# Patient Record
Sex: Male | Born: 1970 | Race: Black or African American | Hispanic: No | Marital: Single | Smoking: Never smoker
Health system: Southern US, Community
[De-identification: ages and names within clinical notes are randomized; demographics above are authoritative.]

## PROBLEM LIST (undated history)

## (undated) DIAGNOSIS — M109 Gout, unspecified: Secondary | ICD-10-CM

## (undated) DIAGNOSIS — I10 Essential (primary) hypertension: Secondary | ICD-10-CM

## (undated) DIAGNOSIS — W3400XA Accidental discharge from unspecified firearms or gun, initial encounter: Secondary | ICD-10-CM

## (undated) HISTORY — PX: OTHER SURGICAL HISTORY: SHX169

---

## 2005-09-09 ENCOUNTER — Emergency Department (HOSPITAL_COMMUNITY): Admission: EM | Admit: 2005-09-09 | Discharge: 2005-09-09 | Payer: Self-pay | Admitting: Family Medicine

## 2006-02-26 ENCOUNTER — Emergency Department (HOSPITAL_COMMUNITY): Admission: EM | Admit: 2006-02-26 | Discharge: 2006-02-26 | Payer: Self-pay | Admitting: Emergency Medicine

## 2006-12-15 ENCOUNTER — Emergency Department (HOSPITAL_COMMUNITY): Admission: EM | Admit: 2006-12-15 | Discharge: 2006-12-15 | Payer: Self-pay | Admitting: Emergency Medicine

## 2007-03-13 ENCOUNTER — Emergency Department (HOSPITAL_COMMUNITY): Admission: EM | Admit: 2007-03-13 | Discharge: 2007-03-13 | Payer: Self-pay | Admitting: Emergency Medicine

## 2007-03-14 ENCOUNTER — Emergency Department (HOSPITAL_COMMUNITY): Admission: EM | Admit: 2007-03-14 | Discharge: 2007-03-14 | Payer: Self-pay | Admitting: Emergency Medicine

## 2010-10-18 LAB — I-STAT 8, (EC8 V) (CONVERTED LAB)
BUN: 17
Bicarbonate: 26.5 — ABNORMAL HIGH
Chloride: 105
Glucose, Bld: 123 — ABNORMAL HIGH
HCT: 45
Hemoglobin: 15.3
Operator id: 222501
Sodium: 138

## 2010-10-18 LAB — URIC ACID: Uric Acid, Serum: 8.1 — ABNORMAL HIGH

## 2010-10-18 LAB — DIFFERENTIAL
Basophils Relative: 0
Eosinophils Absolute: 0.1
Monocytes Relative: 5
Neutrophils Relative %: 58

## 2010-10-18 LAB — CBC
HCT: 40.9
RBC: 4.96
WBC: 9.1

## 2010-11-05 LAB — POCT I-STAT CREATININE: Operator id: 192351

## 2010-11-05 LAB — CBC
HCT: 42.5
Hemoglobin: 14
Platelets: 312
RDW: 14.1
WBC: 4.6

## 2010-11-05 LAB — COMPREHENSIVE METABOLIC PANEL
ALT: 39
Albumin: 3.9
BUN: 10
CO2: 30
GFR calc Af Amer: >60
Potassium: 3.8
Total Bilirubin: 0.6

## 2010-11-05 LAB — URINALYSIS, ROUTINE W REFLEX MICROSCOPIC
Glucose, UA: NEGATIVE
Ketones, ur: NEGATIVE
Leukocytes, UA: NEGATIVE
Nitrite: NEGATIVE
Urobilinogen, UA: 0.2

## 2010-11-05 LAB — I-STAT 8, (EC8 V) (CONVERTED LAB)
Acid-Base Excess: 3 — ABNORMAL HIGH
BUN: 10
Chloride: 102
Hemoglobin: 16.3
Operator id: 192351
pH, Ven: 7.347 — ABNORMAL HIGH

## 2010-11-05 LAB — POCT CARDIAC MARKERS
CKMB, poc: <1 — ABNORMAL LOW
Myoglobin, poc: 118
Operator id: 192351
Troponin i, poc: <0.05

## 2010-11-05 LAB — DIFFERENTIAL
Eosinophils Relative: 4
Lymphocytes Relative: 42
Monocytes Relative: 8
Neutro Abs: 2.1

## 2010-11-05 LAB — URINE MICROSCOPIC-ADD ON

## 2010-11-05 LAB — ETHANOL: Alcohol, Ethyl (B): <5

## 2013-02-10 ENCOUNTER — Emergency Department: Payer: Self-pay | Admitting: Emergency Medicine

## 2013-02-10 LAB — CBC WITH DIFFERENTIAL/PLATELET
Basophil #: 0.1 10*3/uL (ref 0.0–0.1)
Basophil %: 1.1 %
Eosinophil #: 0.1 10*3/uL (ref 0.0–0.7)
Eosinophil %: 2.8 %
HCT: 43.8 % (ref 40.0–52.0)
HGB: 14.5 g/dL (ref 13.0–18.0)
LYMPHS ABS: 2.2 10*3/uL (ref 1.0–3.6)
Lymphocyte %: 41.1 %
MCH: 26.8 pg (ref 26.0–34.0)
MCHC: 33.1 g/dL (ref 32.0–36.0)
MCV: 81 fL (ref 80–100)
MONOS PCT: 9.1 %
Monocyte #: 0.5 x10 3/mm (ref 0.2–1.0)
Neutrophil #: 2.5 10*3/uL (ref 1.4–6.5)
Neutrophil %: 45.9 %
Platelet: 272 10*3/uL (ref 150–440)
RBC: 5.4 10*6/uL (ref 4.40–5.90)
RDW: 13.9 % (ref 11.5–14.5)
WBC: 5.3 10*3/uL (ref 3.8–10.6)

## 2013-02-10 LAB — COMPREHENSIVE METABOLIC PANEL
ALBUMIN: 4 g/dL (ref 3.4–5.0)
ANION GAP: 4 — AB (ref 7–16)
Alkaline Phosphatase: 91 U/L
BUN: 12 mg/dL (ref 7–18)
Bilirubin,Total: 0.4 mg/dL (ref 0.2–1.0)
CHLORIDE: 100 mmol/L (ref 98–107)
Calcium, Total: 9.1 mg/dL (ref 8.5–10.1)
Co2: 31 mmol/L (ref 21–32)
Creatinine: 1.36 mg/dL — ABNORMAL HIGH (ref 0.60–1.30)
EGFR (African American): >60
EGFR (Non-African Amer.): >60
Glucose: 100 mg/dL — ABNORMAL HIGH (ref 65–99)
Osmolality: 270 (ref 275–301)
Potassium: 3.9 mmol/L (ref 3.5–5.1)
SGOT(AST): 34 U/L (ref 15–37)
SGPT (ALT): 57 U/L (ref 12–78)
SODIUM: 135 mmol/L — AB (ref 136–145)
Total Protein: 8.3 g/dL — ABNORMAL HIGH (ref 6.4–8.2)

## 2013-02-10 LAB — URINALYSIS, COMPLETE
Bilirubin,UR: NEGATIVE
GLUCOSE, UR: NEGATIVE mg/dL (ref 0–75)
Ketone: NEGATIVE
Leukocyte Esterase: NEGATIVE
Nitrite: NEGATIVE
Ph: 6 (ref 4.5–8.0)
Protein: 30
RBC,UR: 16 /HPF (ref 0–5)
SPECIFIC GRAVITY: 1.019 (ref 1.003–1.030)
Squamous Epithelial: <1
WBC UR: <1 /HPF (ref 0–5)

## 2013-02-10 LAB — LIPASE, BLOOD: LIPASE: 110 U/L (ref 73–393)

## 2013-02-10 LAB — TROPONIN I: Troponin-I: <0.02 ng/mL

## 2013-09-29 ENCOUNTER — Emergency Department: Payer: Self-pay | Admitting: Emergency Medicine

## 2013-09-29 LAB — CBC WITH DIFFERENTIAL/PLATELET
BASOS ABS: 0.1 10*3/uL (ref 0.0–0.1)
Basophil %: 1.1 %
EOS ABS: 0.3 10*3/uL (ref 0.0–0.7)
EOS PCT: 4.6 %
HCT: 39.5 % — AB (ref 40.0–52.0)
HGB: 12.5 g/dL — AB (ref 13.0–18.0)
LYMPHS ABS: 2.5 10*3/uL (ref 1.0–3.6)
Lymphocyte %: 45.6 %
MCH: 26.6 pg (ref 26.0–34.0)
MCHC: 31.7 g/dL — ABNORMAL LOW (ref 32.0–36.0)
MCV: 84 fL (ref 80–100)
MONO ABS: 0.3 x10 3/mm (ref 0.2–1.0)
MONOS PCT: 4.7 %
NEUTROS PCT: 44 %
Neutrophil #: 2.4 10*3/uL (ref 1.4–6.5)
Platelet: 252 10*3/uL (ref 150–440)
RBC: 4.71 10*6/uL (ref 4.40–5.90)
RDW: 14.5 % (ref 11.5–14.5)
WBC: 5.5 10*3/uL (ref 3.8–10.6)

## 2013-09-29 LAB — BASIC METABOLIC PANEL
Anion Gap: 5 — ABNORMAL LOW (ref 7–16)
BUN: 17 mg/dL (ref 7–18)
CHLORIDE: 108 mmol/L — AB (ref 98–107)
CO2: 28 mmol/L (ref 21–32)
CREATININE: 1.66 mg/dL — AB (ref 0.60–1.30)
Calcium, Total: 8.2 mg/dL — ABNORMAL LOW (ref 8.5–10.1)
GFR CALC AF AMER: 58 — AB
GFR CALC NON AF AMER: 50 — AB
GLUCOSE: 102 mg/dL — AB (ref 65–99)
OSMOLALITY: 283 (ref 275–301)
Potassium: 3.6 mmol/L (ref 3.5–5.1)
Sodium: 141 mmol/L (ref 136–145)

## 2013-09-29 LAB — TROPONIN I

## 2014-03-01 ENCOUNTER — Encounter (HOSPITAL_COMMUNITY): Payer: Self-pay | Admitting: Emergency Medicine

## 2014-03-01 ENCOUNTER — Emergency Department (HOSPITAL_COMMUNITY)
Admission: EM | Admit: 2014-03-01 | Discharge: 2014-03-02 | Disposition: A | Discharge status: Home / Self Care | Payer: Self-pay | Attending: Emergency Medicine | Admitting: Emergency Medicine

## 2014-03-01 ENCOUNTER — Emergency Department (HOSPITAL_COMMUNITY): Payer: Self-pay

## 2014-03-01 DIAGNOSIS — M25562 Pain in left knee: Secondary | ICD-10-CM | POA: Insufficient documentation

## 2014-03-01 DIAGNOSIS — R609 Edema, unspecified: Secondary | ICD-10-CM | POA: Insufficient documentation

## 2014-03-01 NOTE — ED Notes (Signed)
Pt c/o left knee pain since yesterday and denies any injury.

## 2014-03-02 MED ORDER — IBUPROFEN 800 MG PO TABS: 800.0000 mg | ORAL_TABLET | Freq: Three times a day (TID) | ORAL | Status: DC

## 2014-03-02 MED ORDER — OXYCODONE-ACETAMINOPHEN 5-325 MG PO TABS
2.0000 | ORAL_TABLET | Freq: Once | ORAL | Status: AC
  Administered 2014-03-02: 2 via ORAL
  Filled 2014-03-02: qty 2

## 2014-03-02 MED ORDER — OXYCODONE-ACETAMINOPHEN 5-325 MG PO TABS: 1.0000 | ORAL_TABLET | ORAL | Status: DC | PRN

## 2014-03-02 NOTE — Discharge Instructions (Signed)

## 2014-03-02 NOTE — ED Provider Notes (Signed)
CSN: [id number]     Arrival date & time 03/01/14  2338 History   First MD Initiated Contact with Patient 03/02/14 0014     Chief Complaint  Patient presents with  . Knee Pain     (Consider location/radiation/quality/duration/timing/severity/associated sxs/prior Treatment) HPI   Barry Day is a 44 y.o. male who presents to the Emergency Department complaining of gradual onset of left knee pain for one day.  He reports playing basketball 4 days ago and gradual pain and swelling to the knee that worsened last evening.  Patient reports he has been applying heat to the knee and reporting increased swelling and pain since.  He OTC pain medication yesterday, but nothing since then.  Pain is worse with movement. He denies fever, chills, recent illness, numbness, or pain distal to the knee.  Patient does report occasional achy pain to the knee prior to his recent activity.       History reviewed. No pertinent past medical history. History reviewed. No pertinent past surgical history. No family history on file. History  Substance Use Topics  . Smoking status: Never Smoker   . Smokeless tobacco: Not on file  . Alcohol Use: No    Review of Systems  Constitutional: Negative for fever and chills.  Musculoskeletal: Positive for joint swelling and arthralgias (left knee pain). Negative for back pain.  Skin: Negative for color change and wound.  Neurological: Negative for weakness and numbness.  All other systems reviewed and are negative.     Allergies  Review of patient's allergies indicates no known allergies.  Home Medications   Prior to Admission medications   Medication Sig Start Date End Date Taking? Authorizing Provider  ibuprofen (ADVIL,MOTRIN) 800 MG tablet Take 1 tablet (800 mg total) by mouth 3 (three) times daily. 03/02/14   Jetaime Pinnix L. Michal Callicott, PA-C  oxyCODONE-acetaminophen (PERCOCET/ROXICET) 5-325 MG per tablet Take 1 tablet by mouth every 4 (four) hours as needed.  03/02/14   Sheritta Deeg L. Saga Balthazar, PA-C  oxyCODONE-acetaminophen (PERCOCET/ROXICET) 5-325 MG per tablet Take 1 tablet by mouth every 4 (four) hours as needed. 03/02/14   Rosy Estabrook L. Tae Robak, PA-C   BP 160/94 mmHg  Pulse 62  Temp(Src) 98 F (36.7 C)  Resp 17  Ht 5\' 9"  (1.753 m)  Wt 240 lb (108.863 kg)  BMI 35.43 kg/m2  SpO2 97% Physical Exam  Constitutional: He is oriented to person, place, and time. He appears well-developed and well-nourished. No distress.  Cardiovascular: Normal rate, regular rhythm, normal heart sounds and intact distal pulses.   Pulmonary/Chest: Effort normal and breath sounds normal. No respiratory distress.  Musculoskeletal: He exhibits edema and tenderness.  Diffuse ttp of the anterior left knee.  No erythema, excessive warmth or step-off deformity.  DP pulse brisk, distal sensation intact. Calf is soft and NT.  Neurological: He is alert and oriented to person, place, and time. He exhibits normal muscle tone. Coordination normal.  Skin: Skin is warm and dry. No erythema.  Nursing note and vitals reviewed.   ED Course  Procedures (including critical care time) Labs Review Labs Reviewed - No data to display  Imaging Review Dg Knee Complete 4 Views Left  03/02/2014   CLINICAL DATA:  Left knee pain and swelling for 2 days. Increased pain tonight, now severe.  EXAM: LEFT KNEE - COMPLETE 4+ VIEW  COMPARISON:  None.  FINDINGS: No fracture or dislocation. The alignment and joint spaces are maintained. There are quadriceps and patellar enthesophytes. Bony fragmentation in the anterior tibial  tubercle appears chronic. No osseous erosions pain There is a large joint effusion. Anterior soft tissue edema.  IMPRESSION: 1. Large joint effusion and anterior soft tissue edema. 2. No acute bony abnormality. 3. Enthesophytes at the patellar and quadriceps tendons.   Electronically Signed   By: Rubye Oaks M.D.   On: 03/02/2014 00:35     EKG Interpretation None      MDM   Final  diagnoses:  Left knee pain    Patient is feeling better after ice packs and pain medication.  Left knee pain after playing basketball, but no known injury.  Effusion is present, but likely inflammatory reaction.  clinical suspicion for septic joint is low.  Pt agrees to RICE therapy and close ortho f/u this week if not improving.  Patient also discussed with Dr. Bebe Shaggy     Marvelous Bouwens L. Rowe Robert 03/02/14 0116  Joya Gaskins, MD 03/02/14 [phone]

## 2014-10-29 ENCOUNTER — Emergency Department (HOSPITAL_COMMUNITY): Payer: Medicaid Other

## 2014-10-29 ENCOUNTER — Emergency Department (HOSPITAL_COMMUNITY)
Admission: EM | Admit: 2014-10-29 | Discharge: 2014-10-29 | Disposition: A | Discharge status: Home / Self Care | Payer: Medicaid Other | Attending: Emergency Medicine | Admitting: Emergency Medicine

## 2014-10-29 ENCOUNTER — Encounter (HOSPITAL_COMMUNITY): Payer: Self-pay

## 2014-10-29 DIAGNOSIS — Z87828 Personal history of other (healed) physical injury and trauma: Secondary | ICD-10-CM | POA: Insufficient documentation

## 2014-10-29 DIAGNOSIS — M109 Gout, unspecified: Secondary | ICD-10-CM | POA: Diagnosis not present

## 2014-10-29 DIAGNOSIS — I10 Essential (primary) hypertension: Secondary | ICD-10-CM | POA: Insufficient documentation

## 2014-10-29 DIAGNOSIS — M79605 Pain in left leg: Secondary | ICD-10-CM | POA: Diagnosis not present

## 2014-10-29 DIAGNOSIS — Z791 Long term (current) use of non-steroidal anti-inflammatories (NSAID): Secondary | ICD-10-CM | POA: Diagnosis not present

## 2014-10-29 DIAGNOSIS — M79602 Pain in left arm: Secondary | ICD-10-CM | POA: Insufficient documentation

## 2014-10-29 DIAGNOSIS — R200 Anesthesia of skin: Secondary | ICD-10-CM | POA: Insufficient documentation

## 2014-10-29 HISTORY — DX: Gout, unspecified: M10.9

## 2014-10-29 HISTORY — DX: Essential (primary) hypertension: I10

## 2014-10-29 HISTORY — DX: Accidental discharge from unspecified firearms or gun, initial encounter: W34.00XA

## 2014-10-29 LAB — CBC WITH DIFFERENTIAL/PLATELET
BASOS ABS: 0 10*3/uL (ref 0.0–0.1)
BASOS PCT: 0 %
Eosinophils Absolute: 0.2 10*3/uL (ref 0.0–0.7)
Eosinophils Relative: 3 %
HEMATOCRIT: 41.4 % (ref 39.0–52.0)
Hemoglobin: 13.5 g/dL (ref 13.0–17.0)
LYMPHS PCT: 41 %
Lymphs Abs: 2.7 10*3/uL (ref 0.7–4.0)
MCH: 27 pg (ref 26.0–34.0)
MCHC: 32.6 g/dL (ref 30.0–36.0)
MCV: 82.8 fL (ref 78.0–100.0)
Monocytes Absolute: 0.3 10*3/uL (ref 0.1–1.0)
Monocytes Relative: 5 %
NEUTROS ABS: 3.3 10*3/uL (ref 1.7–7.7)
Neutrophils Relative %: 51 %
PLATELETS: 297 10*3/uL (ref 150–400)
RBC: 5 MIL/uL (ref 4.22–5.81)
RDW: 14.5 % (ref 11.5–15.5)
WBC: 6.5 10*3/uL (ref 4.0–10.5)

## 2014-10-29 LAB — COMPREHENSIVE METABOLIC PANEL
ALBUMIN: 4.4 g/dL (ref 3.5–5.0)
ALT: 45 U/L (ref 17–63)
AST: 32 U/L (ref 15–41)
Alkaline Phosphatase: 84 U/L (ref 38–126)
Anion gap: 8 (ref 5–15)
BILIRUBIN TOTAL: 0.5 mg/dL (ref 0.3–1.2)
BUN: 17 mg/dL (ref 6–20)
CHLORIDE: 105 mmol/L (ref 101–111)
CO2: 28 mmol/L (ref 22–32)
CREATININE: 1.49 mg/dL — AB (ref 0.61–1.24)
Calcium: 9 mg/dL (ref 8.9–10.3)
GFR calc Af Amer: >60 mL/min (ref >60)
GFR calc non Af Amer: 56 mL/min — ABNORMAL LOW (ref >60)
GLUCOSE: 100 mg/dL — AB (ref 65–99)
POTASSIUM: 3.9 mmol/L (ref 3.5–5.1)
Sodium: 141 mmol/L (ref 135–145)
Total Protein: 8 g/dL (ref 6.5–8.1)

## 2014-10-29 LAB — SEDIMENTATION RATE: SED RATE: 2 mm/h (ref 0–16)

## 2014-10-29 MED ORDER — LISINOPRIL-HYDROCHLOROTHIAZIDE 10-12.5 MG PO TABS: 1.0000 | ORAL_TABLET | Freq: Every day | ORAL | Status: DC

## 2014-10-29 MED ORDER — ACETAMINOPHEN 500 MG PO TABS
1000.0000 mg | ORAL_TABLET | Freq: Once | ORAL | Status: AC
  Administered 2014-10-29: 1000 mg via ORAL

## 2014-10-29 MED ORDER — ACETAMINOPHEN 500 MG PO TABS
ORAL_TABLET | ORAL | Status: AC
  Filled 2014-10-29: qty 1
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
## 2014-10-29 NOTE — ED Notes (Signed)
Discharge instructions given, pt demonstrated teach back and verbal understanding. No concerns voiced.  

## 2014-10-29 NOTE — ED Notes (Signed)
PT reports pain in left side of body for the past several weeks.  Reports today at 1430 had sudden onset of numbness in left side of body that lasted for 2 hours.  Reports has gout in left foot.  ALso c/o left hand being swollen.

## 2014-10-29 NOTE — ED Notes (Signed)
Pt c/o left sided pain x several weeks that has gotten increasingly worse today. Pt reports he had sudden onset of left sided numbness that lasted about 2 hours today. Pt reports history of gout. Pt very tender upon palpation of left hand and left foot. Pt denies any numbness now.

## 2014-10-29 NOTE — Discharge Instructions (Signed)
Scan of your brain showed no serious findings. Will start blood pressure medicine. This can be obtained at [facility] for $4/month. You will need primary care follow-up. Resource guide given. X-Strength Tylenol for pain.    Emergency Department Resource Guide 1) Find a Doctor and Pay Out of Pocket Although you won't have to find out who is covered by your insurance plan, it is a good idea to ask around and get recommendations. You will then need to call the office and see if the doctor you have chosen will accept you as a new patient and what types of options they offer for patients who are self-pay. Some doctors offer discounts or will set up payment plans for their patients who do not have insurance, but you will need to ask so you aren't surprised when you get to your appointment.  2) Contact Your Local Health Department Not all health departments have doctors that can see patients for sick visits, but many do, so it is worth a call to see if yours does. If you don't know where your local health department is, you can check in your phone book. The CDC also has a tool to help you locate your state's health department, and many state websites also have listings of all of their local health departments.  3) Find a Walk-in Clinic If your illness is not likely to be very severe or complicated, you may want to try a walk in clinic. These are popping up all over the country in pharmacies, drugstores, and shopping centers. They're usually staffed by nurse practitioners or physician assistants that have been trained to treat common illnesses and complaints. They're usually fairly quick and inexpensive. However, if you have serious medical issues or chronic medical problems, these are probably not your best option.  No Primary Care Doctor: - Call Health Connect at  [phone] - they can help you locate a primary care doctor that  accepts your insurance, provides certain services, etc. - Physician Referral  Service- [phone]  Chronic Pain Problems: Organization         Address  Phone   Notes  [facility]  [phone] Patients need to be referred by their primary care doctor.   Medication Assistance: Organization         Address  Phone   Notes  [facility] Medication [facility] [address] [place] [place], [place] [address] [phone] --Must be a resident of [facility] -- Must have NO insurance coverage whatsoever (no Medicaid/ Medicare, etc.) -- The pt. MUST have a primary care doctor that directs their care regularly and follows them in the community   Med[facility]ist  [phone]   [employer]  [phone]    Agencies that provide inexpensive medical care: Organization         Address  Phone   Notes  [facility]  [phone]   [facility]    [phone]   [facility] [address] [place], [place] [address] [phone]   [facility] [address] [place] [place] [phone]   Planned Parenthood    [phone]   [facility]    [phone]   Community [facility] and [facility]  [address], [place] Phone:  [phone], Fax:  [phone] Hours of Operation:  9 am - 6 pm, M-F.  Also accepts Medicaid/Medicare and self-pay.  [facility]  for [facility]  [address], [place] Phone: [phone], Fax: [phone]. Hours of Operation:  8:30 am - 5:30 pm, M-F.  Also accepts Medicaid and self-pay.  [facility] [place] [address] [place] Phone: [phone]   [place], [place], [place] [phone], Ext. [phone] Mondays & Thursdays: 7-9 AM.  First 15 patients are seen on a first come, first serve basis.    [place] Providers:  Organization         Address  Phone   Notes  [facility] [address], [place] [phone] Also accepts self-pay patients.  [place] [id number] [place], [place]  [phone]   [place], [address], [place] [phone]   [facility] [address] [place] [phone]   Lucianne Lei [address], [place]   [phone] Only accepts [place] Access [place] patients after they have their name applied to their card.   Self-Pay (no insurance) in [facility]:  Organization         Address  Phone   Notes  Sickle Cell Patients, [facility] Internal Medicine [place] [phone]   [facility] [place] [phone]   [facility] [place]  [place], [place], [place] [phone]   [facility]/Dr. Osei-Bonsu  [address] [place] or [address], [place] [phone] Phone number for both [place] and [place] locations is the same.  Urgent Medical and [facility] [address] [place] [phone]   [facility] [address], [place] or [address] [phone]   [facility] [address] [place] [phone], phone; [phone], fax Sees patients 1st and 3rd Saturday of every month.  Must not qualify for public or private insurance (i.e. Medicaid, Medicare, [place] Health Choice, Veterans' Benefits)  Household income should be no more than 200% of the poverty level The clinic cannot treat you if you are pregnant or think you are pregnant  Sexually transmitted diseases are not treated at the clinic.    Dental Care: Organization         Address  Phone  Notes  [facility] Department of [facility] [place] [phone] Accepts children up to age 55 who are enrolled in  [place] or [place]; pregnant women with a Medicaid card; and children who have applied for Medicaid or [place] Health Choice, but were declined, whose parents can pay a reduced fee at time of service.  [facility] Department of [facility]  [address], [place] [phone] Accepts children up to age 34 who are enrolled in [place] or [place]; pregnant women with a Medicaid card; and children who have applied for Medicaid or [place] Health Choice, but were declined, whose parents can pay a reduced fee at time of service.  [facility] Adult [facility] Access PRO[facility]  [place] [phone] Patients are seen by appointment only. Walk-ins are not accepted. [place] will see patients 77 years of age and older. Monday - Tuesday (8am-5pm) Most Wednesdays (8:30-5pm) $30 per visit, cash only  [place] [facility]  [address], [facility] [phone] Patients are seen by appointment only. Walk-ins are not accepted. [place] will see patients 55 years of age and older. One Wednesday Evening (Monthly: Volunteer Based).  $30 per visit, cash only  [place]  [phone] for adults; Children under age 16, call Graduate Pediatric Dentistry at [phone]. Children aged 15-14, please call [phone] to request a pediatric application.  Dental services are provided in all areas of dental care including fillings, crowns and bridges, complete and partial dentures, implants, gum treatment, root canals, and extractions. Preventive care is also provided. Treatment is provided to both adults and children. Patients are selected via a lottery and there is often a waiting list.   [facility] [address] [place]  [phone] [email or web address]   [facility] [address] [place], [place] [phone], Ext. [phone] Second and Fourth Thursday of each month, opens at 6:30  AM; Clinic ends at 9 AM.  Patients are seen on a first-come first-served basis, and a limited number are seen during each clinic.   [facility]  [address] Hillard Danker [place], [place] [phone]   Eligibility Requirements You must have lived in [place], [place], or [place] counties for at least the last three months.   You cannot be eligible for state or federal sponsored [employer], including [employer], [place], or [employer].   You generally cannot be eligible for healthcare insurance through your employer.    How to apply: Eligibility screenings are held every Tuesday and Wednesday afternoon from 1:00 pm until 4:00 pm. You do not need an appointment for the interview!  [facility] [address] [place], [place]   [employer]  [place] Department  [place]  [phone]    Behavioral Health Resources in the Community: Intensive Outpatient Programs Organization         Address  Phone  Notes  [place]. [address], [place], [place] [phone]   [facility] [address], [place], [employer]   ADS: Alcohol & Drug Svcs [address], [place], [place]   [place] [address]  [place], [place] or [phone]   Substance Abuse Resources Organization         Address  Phone  Notes  Alcohol and Drug Services  [phone]   [place]  [phone]   The [place]   Chinita Pester  [phone]   Residential & Outpatient Substance Abuse Program  [phone]   Psychological Services Organization         Address  Phone  Notes  [facility] [place]  [place]  [phone]   [place] [address] [place] [phone] or  [phone]    Mobile Crisis Teams Organization         Address  Phone  Notes  [facility], Mobile Crisis Care Unit  [phone]   [facility]  [address] [place], [place]   [place] [address] [place] [phone]    Self-Help/Support Groups Organization         Address  Phone             Notes  [facility]oc[facility] of [place] - variety of support groups  [place] Call for more information  Narcotics Anonymous (NA), [facility] [address], [employer] [place]  2 meetings at this location   Special educational needs teacher         Address  Phone  Notes  [facility]idential Treatment [place],    [place]  [phone]   [facility]  [address], [place] [address]3335, [place], [place]   [place], [place] [phone] Admissions: 8am-3pm M-F  Incentives Substance [place] [address]    [place], [place] [phone]   [facility] [address] [place], [place], [place]   [facility] [address]  [place], [place]   [facility] - Intensive Outpatient [address], [place], [place]   [facility] ([place].,  [place], [place] [phone] or [phone]   [facility] Services (RTS) [address] [place], [place] Accepts Medicaid  [facility] [place] [address],  [place] [phone] Substance Abuse/Addiction Treatment   [facility] Organization         Address  Phone  Notes  [facility]  [phone]   Domenic Schwab, PhD [address] [place], [place]   [phone] or [phone]   [place], [place] [phone]   [facility] [address],  [place], [place] [phone] Insurance/Medicaid/sponsorship through [facility] and Families [address] [place]                                    [place], [place] [phone] [place] [address], [place] [phone]    Dr. Adele Schilder  [phone]   [facility] Dept. 1) [address], [place] 2) [place] 3)  [place] [address], [place] [phone]  [phone]   [place] [phone] or [phone] (After Hours)

## 2014-10-30 ENCOUNTER — Emergency Department (HOSPITAL_COMMUNITY)
Admission: EM | Admit: 2014-10-30 | Discharge: 2014-10-30 | Disposition: A | Discharge status: Home / Self Care | Payer: Medicaid Other | Attending: Emergency Medicine | Admitting: Emergency Medicine

## 2014-10-30 ENCOUNTER — Emergency Department (HOSPITAL_COMMUNITY): Payer: Medicaid Other

## 2014-10-30 ENCOUNTER — Encounter (HOSPITAL_COMMUNITY): Payer: Self-pay | Admitting: *Deleted

## 2014-10-30 DIAGNOSIS — I10 Essential (primary) hypertension: Secondary | ICD-10-CM | POA: Diagnosis not present

## 2014-10-30 DIAGNOSIS — M10032 Idiopathic gout, left wrist: Secondary | ICD-10-CM | POA: Diagnosis not present

## 2014-10-30 DIAGNOSIS — Z791 Long term (current) use of non-steroidal anti-inflammatories (NSAID): Secondary | ICD-10-CM | POA: Insufficient documentation

## 2014-10-30 DIAGNOSIS — Z79899 Other long term (current) drug therapy: Secondary | ICD-10-CM | POA: Diagnosis not present

## 2014-10-30 DIAGNOSIS — R0602 Shortness of breath: Secondary | ICD-10-CM | POA: Diagnosis not present

## 2014-10-30 DIAGNOSIS — R531 Weakness: Secondary | ICD-10-CM | POA: Insufficient documentation

## 2014-10-30 DIAGNOSIS — M25532 Pain in left wrist: Secondary | ICD-10-CM

## 2014-10-30 DIAGNOSIS — Z87828 Personal history of other (healed) physical injury and trauma: Secondary | ICD-10-CM | POA: Insufficient documentation

## 2014-10-30 LAB — CBC WITH DIFFERENTIAL/PLATELET
BASOS ABS: 0 10*3/uL (ref 0.0–0.1)
BASOS PCT: 0 %
Eosinophils Absolute: 0.2 10*3/uL (ref 0.0–0.7)
Eosinophils Relative: 3 %
HEMATOCRIT: 40.9 % (ref 39.0–52.0)
HEMOGLOBIN: 13.5 g/dL (ref 13.0–17.0)
Lymphocytes Relative: 45 %
Lymphs Abs: 3 10*3/uL (ref 0.7–4.0)
MCH: 27.3 pg (ref 26.0–34.0)
MCHC: 33 g/dL (ref 30.0–36.0)
MCV: 82.6 fL (ref 78.0–100.0)
MONO ABS: 0.4 10*3/uL (ref 0.1–1.0)
Monocytes Relative: 6 %
NEUTROS ABS: 3 10*3/uL (ref 1.7–7.7)
NEUTROS PCT: 46 %
Platelets: 284 10*3/uL (ref 150–400)
RBC: 4.95 MIL/uL (ref 4.22–5.81)
RDW: 14.4 % (ref 11.5–15.5)
WBC: 6.6 10*3/uL (ref 4.0–10.5)

## 2014-10-30 LAB — BASIC METABOLIC PANEL
ANION GAP: 9 (ref 5–15)
BUN: 17 mg/dL (ref 6–20)
CALCIUM: 8.9 mg/dL (ref 8.9–10.3)
CO2: 28 mmol/L (ref 22–32)
Chloride: 102 mmol/L (ref 101–111)
Creatinine, Ser: 1.53 mg/dL — ABNORMAL HIGH (ref 0.61–1.24)
GFR calc non Af Amer: 55 mL/min — ABNORMAL LOW (ref >60)
Glucose, Bld: 134 mg/dL — ABNORMAL HIGH (ref 65–99)
Potassium: 3.6 mmol/L (ref 3.5–5.1)
SODIUM: 139 mmol/L (ref 135–145)

## 2014-10-30 LAB — TROPONIN I

## 2014-10-30 LAB — URIC ACID: URIC ACID, SERUM: 9.8 mg/dL — AB (ref 4.4–7.6)

## 2014-10-30 MED ORDER — HYDROMORPHONE HCL 1 MG/ML IJ SOLN
1.0000 mg | Freq: Once | INTRAMUSCULAR | Status: AC
  Administered 2014-10-30: 1 mg via INTRAMUSCULAR

## 2014-10-30 MED ORDER — PREDNISONE 50 MG PO TABS: ORAL_TABLET | ORAL | Status: DC

## 2014-10-30 MED ORDER — OXYCODONE-ACETAMINOPHEN 5-325 MG PO TABS
1.0000 | ORAL_TABLET | ORAL | Status: DC | PRN
Start: 2014-10-30 — End: 2015-11-05

## 2014-10-30 MED ORDER — PREDNISONE 50 MG PO TABS
60.0000 mg | ORAL_TABLET | Freq: Once | ORAL | Status: AC
  Administered 2014-10-30: 60 mg via ORAL
  Filled 2014-10-30 (×2): qty 1

## 2014-10-30 MED ORDER — HYDROMORPHONE HCL 1 MG/ML IJ SOLN
INTRAMUSCULAR | Status: AC
  Filled 2014-10-30: qty 1

## 2014-10-30 MED ORDER — LIDOCAINE-EPINEPHRINE (PF) 1 %-1:200000 IJ SOLN
20.0000 mL | Freq: Once | INTRAMUSCULAR | Status: AC
  Administered 2014-10-30: 20 mL
  Filled 2014-10-30: qty 20

## 2014-10-30 MED ORDER — INDOMETHACIN 25 MG PO CAPS: 25.0000 mg | ORAL_CAPSULE | Freq: Three times a day (TID) | ORAL | Status: DC

## 2014-10-30 NOTE — ED Notes (Signed)
Discharge instructions given, pt demonstrated teach back and verbal understanding.splint applied and return demonstration given on application. No concerns voiced.

## 2014-10-30 NOTE — ED Notes (Signed)
Pt left wrist arthrocentesis attempted by EDP.  Pt gave verbal consent, and sterile technique used by EDP.

## 2014-10-30 NOTE — ED Notes (Signed)
Pt c/o left arm pain that started today and is c/o numbness; pt was seen yesterday for same complaint

## 2014-10-30 NOTE — ED Notes (Signed)
Swelling to left wrist has not increased, pain medicine given per order. Arm elevated on towels to help prevent further swelling. Pt states he is comfortable at this time. Will continue to monitor for pain and swelling.
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
## 2014-10-30 NOTE — Discharge Instructions (Signed)
Gout Keep your wrist splinted and elevated. Followup with Dr. Romeo Apple today.  Take the gout medication as prescribed. Return to the ED if you develop worsening pain, fever, or any other concerns. Gout is an inflammatory arthritis caused by a buildup of uric acid crystals in the joints. Uric acid is a chemical that is normally present in the blood. When the level of uric acid in the blood is too high it can form crystals that deposit in your joints and tissues. This causes joint redness, soreness, and swelling (inflammation). Repeat attacks are common. Over time, uric acid crystals can form into masses (tophi) near a joint, destroying bone and causing disfigurement. Gout is treatable and often preventable. CAUSES  The disease begins with elevated levels of uric acid in the blood. Uric acid is produced by your body when it breaks down a naturally found substance called purines. Certain foods you eat, such as meats and fish, contain high amounts of purines. Causes of an elevated uric acid level include:  Being passed down from parent to child (heredity).  Diseases that cause increased uric acid production (such as obesity, psoriasis, and certain cancers).  Excessive alcohol use.  Diet, especially diets rich in meat and seafood.  Medicines, including certain cancer-fighting medicines (chemotherapy), water pills (diuretics), and aspirin.  Chronic kidney disease. The kidneys are no longer able to remove uric acid well.  Problems with metabolism. Conditions strongly associated with gout include:  Obesity.  High blood pressure.  High cholesterol.  Diabetes. Not everyone with elevated uric acid levels gets gout. It is not understood why some people get gout and others do not. Surgery, joint injury, and eating too much of certain foods are some of the factors that can lead to gout attacks. SYMPTOMS   An attack of gout comes on quickly. It causes intense pain with redness, swelling, and warmth  in a joint.  Fever can occur.  Often, only one joint is involved. Certain joints are more commonly involved:  Base of the big toe.  Knee.  Ankle.  Wrist.  Finger. Without treatment, an attack usually goes away in a few days to weeks. Between attacks, you usually will not have symptoms, which is different from many other forms of arthritis. DIAGNOSIS  Your caregiver will suspect gout based on your symptoms and exam. In some cases, tests may be recommended. The tests may include:  Blood tests.  Urine tests.  X-rays.  Joint fluid exam. This exam requires a needle to remove fluid from the joint (arthrocentesis). Using a microscope, gout is confirmed when uric acid crystals are seen in the joint fluid. TREATMENT  There are two phases to gout treatment: treating the sudden onset (acute) attack and preventing attacks (prophylaxis).  Treatment of an Acute Attack.  Medicines are used. These include anti-inflammatory medicines or steroid medicines.  An injection of steroid medicine into the affected joint is sometimes necessary.  The painful joint is rested. Movement can worsen the arthritis.  You may use warm or cold treatments on painful joints, depending which works best for you.  Treatment to Prevent Attacks.  If you suffer from frequent gout attacks, your caregiver may advise preventive medicine. These medicines are started after the acute attack subsides. These medicines either help your kidneys eliminate uric acid from your body or decrease your uric acid production. You may need to stay on these medicines for a very long time.  The early phase of treatment with preventive medicine can be associated with an increase  in acute gout attacks. For this reason, during the first few months of treatment, your caregiver may also advise you to take medicines usually used for acute gout treatment. Be sure you understand your caregiver's directions. Your caregiver may make several  adjustments to your medicine dose before these medicines are effective.  Discuss dietary treatment with your caregiver or dietitian. Alcohol and drinks high in sugar and fructose and foods such as meat, poultry, and seafood can increase uric acid levels. Your caregiver or dietitian can advise you on drinks and foods that should be limited. HOME CARE INSTRUCTIONS   Do not take aspirin to relieve pain. This raises uric acid levels.  Only take over-the-counter or prescription medicines for pain, discomfort, or fever as directed by your caregiver.  Rest the joint as much as possible. When in bed, keep sheets and blankets off painful areas.  Keep the affected joint raised (elevated).  Apply warm or cold treatments to painful joints. Use of warm or cold treatments depends on which works best for you.  Use crutches if the painful joint is in your leg.  Drink enough fluids to keep your urine clear or pale yellow. This helps your body get rid of uric acid. Limit alcohol, sugary drinks, and fructose drinks.  Follow your dietary instructions. Pay careful attention to the amount of protein you eat. Your daily diet should emphasize fruits, vegetables, whole grains, and fat-free or low-fat milk products. Discuss the use of coffee, vitamin C, and cherries with your caregiver or dietitian. These may be helpful in lowering uric acid levels.  Maintain a healthy body weight. SEEK MEDICAL CARE IF:   You develop diarrhea, vomiting, or any side effects from medicines.  You do not feel better in 24 hours, or you are getting worse. SEEK IMMEDIATE MEDICAL CARE IF:   Your joint becomes suddenly more tender, and you have chills or a fever. MAKE SURE YOU:   Understand these instructions.  Will watch your condition.  Will get help right away if you are not doing well or get worse. Document Released: 01/11/2000 Document Revised: 05/30/2013 Document Reviewed: 08/27/2011 [facility] Patient Information 2015  [place], [place]. This information is not intended to replace advice given to you by your health care provider. Make sure you discuss any questions you have with your health care provider.

## 2015-11-05 ENCOUNTER — Encounter (HOSPITAL_COMMUNITY): Payer: Self-pay

## 2015-11-05 ENCOUNTER — Emergency Department (HOSPITAL_COMMUNITY)
Admission: EM | Admit: 2015-11-05 | Discharge: 2015-11-05 | Disposition: A | Discharge status: Home / Self Care | Payer: Self-pay | Attending: Emergency Medicine | Admitting: Emergency Medicine

## 2015-11-05 ENCOUNTER — Emergency Department (HOSPITAL_COMMUNITY): Payer: Self-pay

## 2015-11-05 DIAGNOSIS — Z79899 Other long term (current) drug therapy: Secondary | ICD-10-CM | POA: Insufficient documentation

## 2015-11-05 DIAGNOSIS — I10 Essential (primary) hypertension: Secondary | ICD-10-CM | POA: Insufficient documentation

## 2015-11-05 DIAGNOSIS — M10072 Idiopathic gout, left ankle and foot: Secondary | ICD-10-CM | POA: Insufficient documentation

## 2015-11-05 LAB — CBC WITH DIFFERENTIAL/PLATELET
Basophils Absolute: 0 10*3/uL (ref 0.0–0.1)
Basophils Relative: 0 %
Eosinophils Absolute: 0.1 10*3/uL (ref 0.0–0.7)
Eosinophils Relative: 2 %
HEMATOCRIT: 39.9 % (ref 39.0–52.0)
Hemoglobin: 13.1 g/dL (ref 13.0–17.0)
LYMPHS PCT: 32 %
Lymphs Abs: 1.9 10*3/uL (ref 0.7–4.0)
MCH: 27 pg (ref 26.0–34.0)
MCHC: 32.8 g/dL (ref 30.0–36.0)
MCV: 82.3 fL (ref 78.0–100.0)
MONO ABS: 0.4 10*3/uL (ref 0.1–1.0)
MONOS PCT: 7 %
NEUTROS ABS: 3.4 10*3/uL (ref 1.7–7.7)
Neutrophils Relative %: 59 %
Platelets: 305 10*3/uL (ref 150–400)
RBC: 4.85 MIL/uL (ref 4.22–5.81)
RDW: 14 % (ref 11.5–15.5)
WBC: 5.8 10*3/uL (ref 4.0–10.5)

## 2015-11-05 LAB — URIC ACID: URIC ACID, SERUM: 6.9 mg/dL (ref 4.4–7.6)

## 2015-11-05 LAB — BASIC METABOLIC PANEL
Anion gap: 5 (ref 5–15)
BUN: 14 mg/dL (ref 6–20)
CALCIUM: 9 mg/dL (ref 8.9–10.3)
CO2: 28 mmol/L (ref 22–32)
CREATININE: 1.29 mg/dL — AB (ref 0.61–1.24)
Chloride: 103 mmol/L (ref 101–111)
GFR calc Af Amer: >60 mL/min (ref >60)
GLUCOSE: 101 mg/dL — AB (ref 65–99)
Potassium: 3.9 mmol/L (ref 3.5–5.1)
Sodium: 136 mmol/L (ref 135–145)

## 2015-11-05 MED ORDER — PROMETHAZINE HCL 12.5 MG PO TABS
12.5000 mg | ORAL_TABLET | Freq: Once | ORAL | Status: AC
  Administered 2015-11-05: 12.5 mg via ORAL
  Filled 2015-11-05: qty 1

## 2015-11-05 MED ORDER — HYDROMORPHONE HCL 1 MG/ML IJ SOLN
1.0000 mg | Freq: Once | INTRAMUSCULAR | Status: AC
  Administered 2015-11-05: 1 mg via INTRAMUSCULAR
  Filled 2015-11-05: qty 1

## 2015-11-05 MED ORDER — DEXAMETHASONE 4 MG PO TABS
4.0000 mg | ORAL_TABLET | Freq: Two times a day (BID) | ORAL | 0 refills | Status: DC
Start: 2015-11-05 — End: 2016-01-13

## 2015-11-05 MED ORDER — PREDNISONE 10 MG PO TABS
60.0000 mg | ORAL_TABLET | Freq: Once | ORAL | Status: AC
  Administered 2015-11-05: 60 mg via ORAL
  Filled 2015-11-05: qty 1

## 2015-11-05 MED ORDER — HYDROCODONE-ACETAMINOPHEN 5-325 MG PO TABS: 1.0000 | ORAL_TABLET | ORAL | 0 refills | Status: DC | PRN

## 2015-11-05 MED ORDER — IBUPROFEN 800 MG PO TABS
800.0000 mg | ORAL_TABLET | Freq: Once | ORAL | Status: AC
  Administered 2015-11-05: 800 mg via ORAL
  Filled 2015-11-05: qty 1

## 2015-11-05 NOTE — Discharge Instructions (Signed)
Your lab work is negative for acute infection. Your x-ray is negative for hidden fracture or foreign body. Please keep your foot warm. Please elevate your foot above your waist. Use crutches until you can safely apply weight to your foot. Use Decadron 2 times daily. Use Norco every 4 hours as needed for more severe pain.
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
## 2015-11-05 NOTE — ED Triage Notes (Signed)
Pt reports pain and swelling to left foot x 3 days.  Reports history of gout.

## 2016-01-13 ENCOUNTER — Emergency Department (HOSPITAL_COMMUNITY): Payer: Self-pay

## 2016-01-13 ENCOUNTER — Encounter (HOSPITAL_COMMUNITY): Payer: Self-pay | Admitting: Emergency Medicine

## 2016-01-13 ENCOUNTER — Emergency Department (HOSPITAL_COMMUNITY)
Admission: EM | Admit: 2016-01-13 | Discharge: 2016-01-13 | Disposition: A | Discharge status: Home / Self Care | Payer: Self-pay | Attending: Emergency Medicine | Admitting: Emergency Medicine

## 2016-01-13 DIAGNOSIS — I10 Essential (primary) hypertension: Secondary | ICD-10-CM | POA: Insufficient documentation

## 2016-01-13 DIAGNOSIS — M10042 Idiopathic gout, left hand: Secondary | ICD-10-CM | POA: Insufficient documentation

## 2016-01-13 DIAGNOSIS — M1000 Idiopathic gout, unspecified site: Secondary | ICD-10-CM

## 2016-01-13 DIAGNOSIS — Z79899 Other long term (current) drug therapy: Secondary | ICD-10-CM | POA: Insufficient documentation

## 2016-01-13 DIAGNOSIS — J208 Acute bronchitis due to other specified organisms: Secondary | ICD-10-CM | POA: Insufficient documentation

## 2016-01-13 MED ORDER — INDOMETHACIN 25 MG PO CAPS: 25.0000 mg | ORAL_CAPSULE | Freq: Three times a day (TID) | ORAL | 0 refills | Status: DC

## 2016-01-13 MED ORDER — HYDROCODONE-ACETAMINOPHEN 5-325 MG PO TABS: 1.0000 | ORAL_TABLET | ORAL | 0 refills | Status: DC | PRN

## 2016-01-13 MED ORDER — ALBUTEROL SULFATE HFA 108 (90 BASE) MCG/ACT IN AERS
2.0000 | INHALATION_SPRAY | Freq: Once | RESPIRATORY_TRACT | Status: AC
  Administered 2016-01-13: 2 via RESPIRATORY_TRACT
  Filled 2016-01-13: qty 6.7

## 2016-01-13 MED ORDER — ONDANSETRON HCL 4 MG PO TABS
4.0000 mg | ORAL_TABLET | Freq: Once | ORAL | Status: AC
  Administered 2016-01-13: 4 mg via ORAL
  Filled 2016-01-13: qty 1

## 2016-01-13 MED ORDER — DEXAMETHASONE 4 MG PO TABS: 4.0000 mg | ORAL_TABLET | Freq: Two times a day (BID) | ORAL | 0 refills | Status: DC

## 2016-01-13 MED ORDER — HYDROCODONE-ACETAMINOPHEN 5-325 MG PO TABS
2.0000 | ORAL_TABLET | Freq: Once | ORAL | Status: AC
  Administered 2016-01-13: 2 via ORAL
  Filled 2016-01-13: qty 2

## 2016-01-13 MED ORDER — INDOMETHACIN 25 MG PO CAPS
25.0000 mg | ORAL_CAPSULE | Freq: Once | ORAL | Status: AC
  Administered 2016-01-13: 25 mg via ORAL
  Filled 2016-01-13: qty 1

## 2016-01-13 MED ORDER — DEXAMETHASONE SODIUM PHOSPHATE 4 MG/ML IJ SOLN
8.0000 mg | Freq: Once | INTRAMUSCULAR | Status: AC
  Administered 2016-01-13: 8 mg via INTRAMUSCULAR
  Filled 2016-01-13: qty 2

## 2016-01-13 NOTE — ED Triage Notes (Signed)
Pt states he has "gout real bad." Pt c/o L hand edema and pain. Pt also reports productive cough x 3 weeks.
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
## 2016-01-13 NOTE — Discharge Instructions (Addendum)
Your blood pressure is elevated at 161/96, otherwise your vital signs within normal limits. Please see your physicians at the health department for reevaluation of your blood pressure. Your examination suggest gout attack, and bronchitis. Please use Decadron 2 times daily, Indocin 2 times daily, and Norco for more severe pain. Use 2 puffs of albuterol every 4 hours. Use vicks inhaler, or decongestant of your choice for nasal congestion. This medication may cause drowsiness. Please do not drink, drive, or participate in activity that requires concentration while taking this medication.

## 2017-03-01 ENCOUNTER — Other Ambulatory Visit: Payer: Self-pay

## 2017-03-01 ENCOUNTER — Emergency Department
Admission: EM | Admit: 2017-03-01 | Discharge: 2017-03-01 | Disposition: A | Discharge status: Home / Self Care | Payer: Self-pay | Attending: Emergency Medicine | Admitting: Emergency Medicine

## 2017-03-01 ENCOUNTER — Encounter: Payer: Self-pay | Admitting: Emergency Medicine

## 2017-03-01 DIAGNOSIS — M79675 Pain in left toe(s): Secondary | ICD-10-CM | POA: Insufficient documentation

## 2017-03-01 DIAGNOSIS — Z5321 Procedure and treatment not carried out due to patient leaving prior to being seen by health care provider: Secondary | ICD-10-CM | POA: Insufficient documentation
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
## 2017-03-01 NOTE — ED Notes (Signed)
See triage note   Presents with pain and swelling to left great toe  Denies any injury  Hx of gout and has been out of meds

## 2017-03-01 NOTE — ED Triage Notes (Signed)
Pt presents to ED with to ED with c/o swelling to L great toe and pain with no known injury. Pt states hx of gout at this time.

## 2018-03-25 ENCOUNTER — Emergency Department
Admission: EM | Admit: 2018-03-25 | Discharge: 2018-03-25 | Disposition: A | Discharge status: Home / Self Care | Payer: No Typology Code available for payment source | Attending: Student in an Organized Health Care Education/Training Program | Admitting: Student in an Organized Health Care Education/Training Program

## 2018-03-25 ENCOUNTER — Other Ambulatory Visit: Payer: Self-pay

## 2018-03-25 ENCOUNTER — Encounter: Payer: Self-pay | Admitting: Emergency Medicine

## 2018-03-25 DIAGNOSIS — Y92410 Unspecified street and highway as the place of occurrence of the external cause: Secondary | ICD-10-CM | POA: Insufficient documentation

## 2018-03-25 DIAGNOSIS — Z79899 Other long term (current) drug therapy: Secondary | ICD-10-CM | POA: Insufficient documentation

## 2018-03-25 DIAGNOSIS — M549 Dorsalgia, unspecified: Secondary | ICD-10-CM | POA: Diagnosis present

## 2018-03-25 DIAGNOSIS — I10 Essential (primary) hypertension: Secondary | ICD-10-CM | POA: Diagnosis not present

## 2018-03-25 DIAGNOSIS — M7918 Myalgia, other site: Secondary | ICD-10-CM | POA: Diagnosis not present

## 2018-03-25 DIAGNOSIS — Y9389 Activity, other specified: Secondary | ICD-10-CM | POA: Insufficient documentation

## 2018-03-25 DIAGNOSIS — Y998 Other external cause status: Secondary | ICD-10-CM | POA: Insufficient documentation

## 2018-03-25 MED ORDER — IBUPROFEN 600 MG PO TABS
600.0000 mg | ORAL_TABLET | Freq: Once | ORAL | Status: AC
  Administered 2018-03-25: 600 mg via ORAL
  Filled 2018-03-25: qty 1

## 2018-03-25 MED ORDER — CYCLOBENZAPRINE HCL 10 MG PO TABS
10.0000 mg | ORAL_TABLET | Freq: Once | ORAL | Status: AC
  Administered 2018-03-25: 10 mg via ORAL
  Filled 2018-03-25: qty 1

## 2018-03-25 MED ORDER — TRAMADOL HCL 50 MG PO TABS: 50.0000 mg | ORAL_TABLET | Freq: Four times a day (QID) | ORAL | 0 refills | Status: AC | PRN

## 2018-03-25 MED ORDER — OXYCODONE-ACETAMINOPHEN 5-325 MG PO TABS
1.0000 | ORAL_TABLET | Freq: Once | ORAL | Status: AC
  Administered 2018-03-25: 1 via ORAL
  Filled 2018-03-25 (×2): qty 1

## 2018-03-25 MED ORDER — CYCLOBENZAPRINE HCL 10 MG PO TABS: 10.0000 mg | ORAL_TABLET | Freq: Three times a day (TID) | ORAL | 0 refills | Status: AC | PRN

## 2018-03-25 MED ORDER — IBUPROFEN 600 MG PO TABS: 600.0000 mg | ORAL_TABLET | Freq: Three times a day (TID) | ORAL | 0 refills | Status: AC | PRN

## 2018-03-25 NOTE — ED Notes (Signed)
Pt discharged home after verbalizing understanding of discharge instructions; nad noted. 
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
## 2019-06-29 ENCOUNTER — Other Ambulatory Visit: Payer: Self-pay

## 2019-06-29 ENCOUNTER — Emergency Department
Admission: EM | Admit: 2019-06-29 | Discharge: 2019-06-29 | Disposition: A | Discharge status: Home / Self Care | Payer: Self-pay | Attending: Student in an Organized Health Care Education/Training Program | Admitting: Student in an Organized Health Care Education/Training Program

## 2019-06-29 ENCOUNTER — Encounter: Payer: Self-pay | Admitting: Emergency Medicine

## 2019-06-29 ENCOUNTER — Emergency Department: Payer: Self-pay

## 2019-06-29 DIAGNOSIS — R600 Localized edema: Secondary | ICD-10-CM | POA: Insufficient documentation

## 2019-06-29 DIAGNOSIS — I10 Essential (primary) hypertension: Secondary | ICD-10-CM | POA: Insufficient documentation

## 2019-06-29 DIAGNOSIS — Z79899 Other long term (current) drug therapy: Secondary | ICD-10-CM | POA: Insufficient documentation

## 2019-06-29 DIAGNOSIS — M10072 Idiopathic gout, left ankle and foot: Secondary | ICD-10-CM | POA: Insufficient documentation

## 2019-06-29 LAB — CBC WITH DIFFERENTIAL/PLATELET
Abs Immature Granulocytes: 0.02 10*3/uL (ref 0.00–0.07)
Basophils Absolute: 0 10*3/uL (ref 0.0–0.1)
Basophils Relative: 0 %
Eosinophils Absolute: 0.1 10*3/uL (ref 0.0–0.5)
Eosinophils Relative: 3 %
HCT: 39.1 % (ref 39.0–52.0)
Hemoglobin: 12.9 g/dL — ABNORMAL LOW (ref 13.0–17.0)
Immature Granulocytes: 0 %
Lymphocytes Relative: 41 %
Lymphs Abs: 2.2 10*3/uL (ref 0.7–4.0)
MCH: 26.7 pg (ref 26.0–34.0)
MCHC: 33 g/dL (ref 30.0–36.0)
MCV: 81 fL (ref 80.0–100.0)
Monocytes Absolute: 0.4 10*3/uL (ref 0.1–1.0)
Monocytes Relative: 8 %
Neutro Abs: 2.7 10*3/uL (ref 1.7–7.7)
Neutrophils Relative %: 48 %
Platelets: 291 10*3/uL (ref 150–400)
RBC: 4.83 MIL/uL (ref 4.22–5.81)
RDW: 13.6 % (ref 11.5–15.5)
WBC: 5.5 10*3/uL (ref 4.0–10.5)
nRBC: 0 % (ref 0.0–0.2)

## 2019-06-29 LAB — BASIC METABOLIC PANEL
Anion gap: 8 (ref 5–15)
BUN: 14 mg/dL (ref 6–20)
CO2: 28 mmol/L (ref 22–32)
Calcium: 9.1 mg/dL (ref 8.9–10.3)
Chloride: 103 mmol/L (ref 98–111)
Creatinine, Ser: 1.37 mg/dL — ABNORMAL HIGH (ref 0.61–1.24)
GFR calc Af Amer: >60 mL/min (ref >60)
GFR calc non Af Amer: >60 mL/min (ref >60)
Glucose, Bld: 88 mg/dL (ref 70–99)
Potassium: 4.2 mmol/L (ref 3.5–5.1)
Sodium: 139 mmol/L (ref 135–145)

## 2019-06-29 LAB — URIC ACID: Uric Acid, Serum: 8.5 mg/dL (ref 3.7–8.6)

## 2019-06-29 LAB — SEDIMENTATION RATE: Sed Rate: 7 mm/hr (ref 0–15)

## 2019-06-29 MED ORDER — COLCHICINE 0.6 MG PO TABS: 0.6000 mg | ORAL_TABLET | Freq: Every day | ORAL | 2 refills | Status: DC

## 2019-06-29 MED ORDER — NAPROXEN 500 MG PO TABS
500.0000 mg | ORAL_TABLET | Freq: Once | ORAL | Status: AC
  Administered 2019-06-29: 500 mg via ORAL
  Filled 2019-06-29: qty 1

## 2019-06-29 MED ORDER — INDOMETHACIN 50 MG PO CAPS: 50.0000 mg | ORAL_CAPSULE | Freq: Three times a day (TID) | ORAL | 0 refills | Status: DC

## 2019-06-29 MED ORDER — TRAMADOL HCL 50 MG PO TABS: 50.0000 mg | ORAL_TABLET | Freq: Four times a day (QID) | ORAL | 0 refills | Status: DC | PRN

## 2019-06-29 MED ORDER — TRAMADOL HCL 50 MG PO TABS
50.0000 mg | ORAL_TABLET | Freq: Four times a day (QID) | ORAL | 0 refills | Status: AC | PRN
Start: 2019-06-29 — End: ?

## 2019-06-29 MED ORDER — COLCHICINE 0.6 MG PO TABS
1.2000 mg | ORAL_TABLET | Freq: Once | ORAL | Status: AC
  Administered 2019-06-29: 1.2 mg via ORAL
  Filled 2019-06-29: qty 2

## 2019-06-29 MED ORDER — COLCHICINE 0.6 MG PO TABS
0.6000 mg | ORAL_TABLET | Freq: Every day | ORAL | 2 refills | Status: AC
Start: 2019-06-29 — End: 2020-06-28

## 2019-06-29 NOTE — ED Triage Notes (Addendum)
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  Knee pain and swelling improved and now right foot  Is painful and swollen.  Patient states he has history of gout, but has not had a flare in 3 years.
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
## 2019-06-29 NOTE — Discharge Instructions (Addendum)
Follow discharge care instruction take medication as directed.  Advised to follow-up with podiatry as needed.

## 2019-06-29 NOTE — ED Notes (Signed)
See triage note  Presents with pain to left foot   Also some swelling to right knee

## 2019-09-05 ENCOUNTER — Ambulatory Visit: Payer: Self-pay | Admitting: Family

## 2019-10-11 ENCOUNTER — Ambulatory Visit: Payer: Self-pay | Admitting: Family

## 2019-10-11 DIAGNOSIS — Z0289 Encounter for other administrative examinations: Secondary | ICD-10-CM

## 2020-06-27 IMAGING — DX DG FOOT COMPLETE 3+V*L*
3 series · 3 of 3 positions shown · non-contrast
Comparison: 11/05/2015

CLINICAL DATA: Left foot pain and swelling.  History of gout

EXAM:
LEFT FOOT - COMPLETE 3+ VIEW

[foot ap]
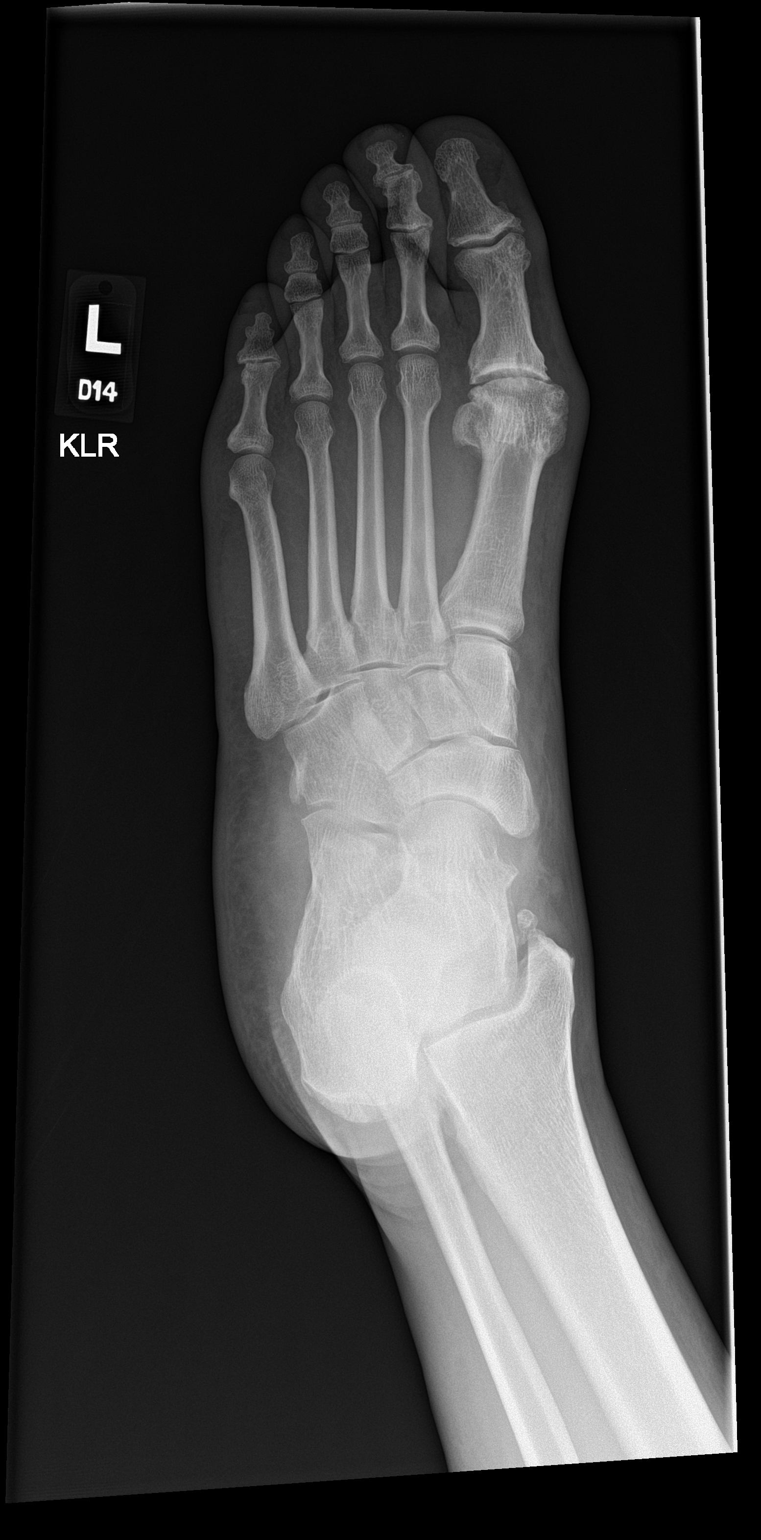

[foot obl]
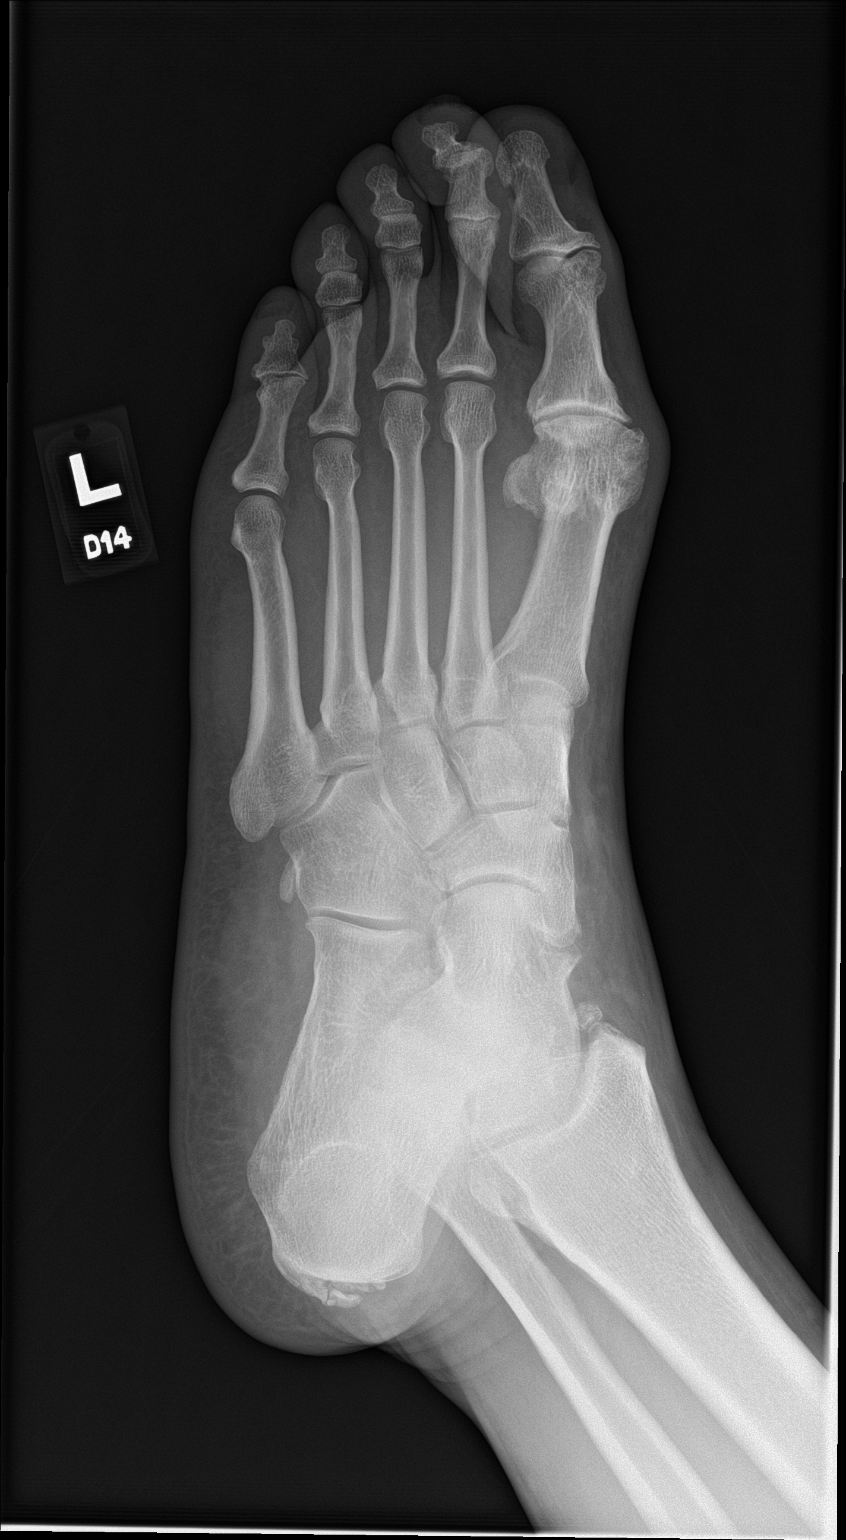

[foot lat]
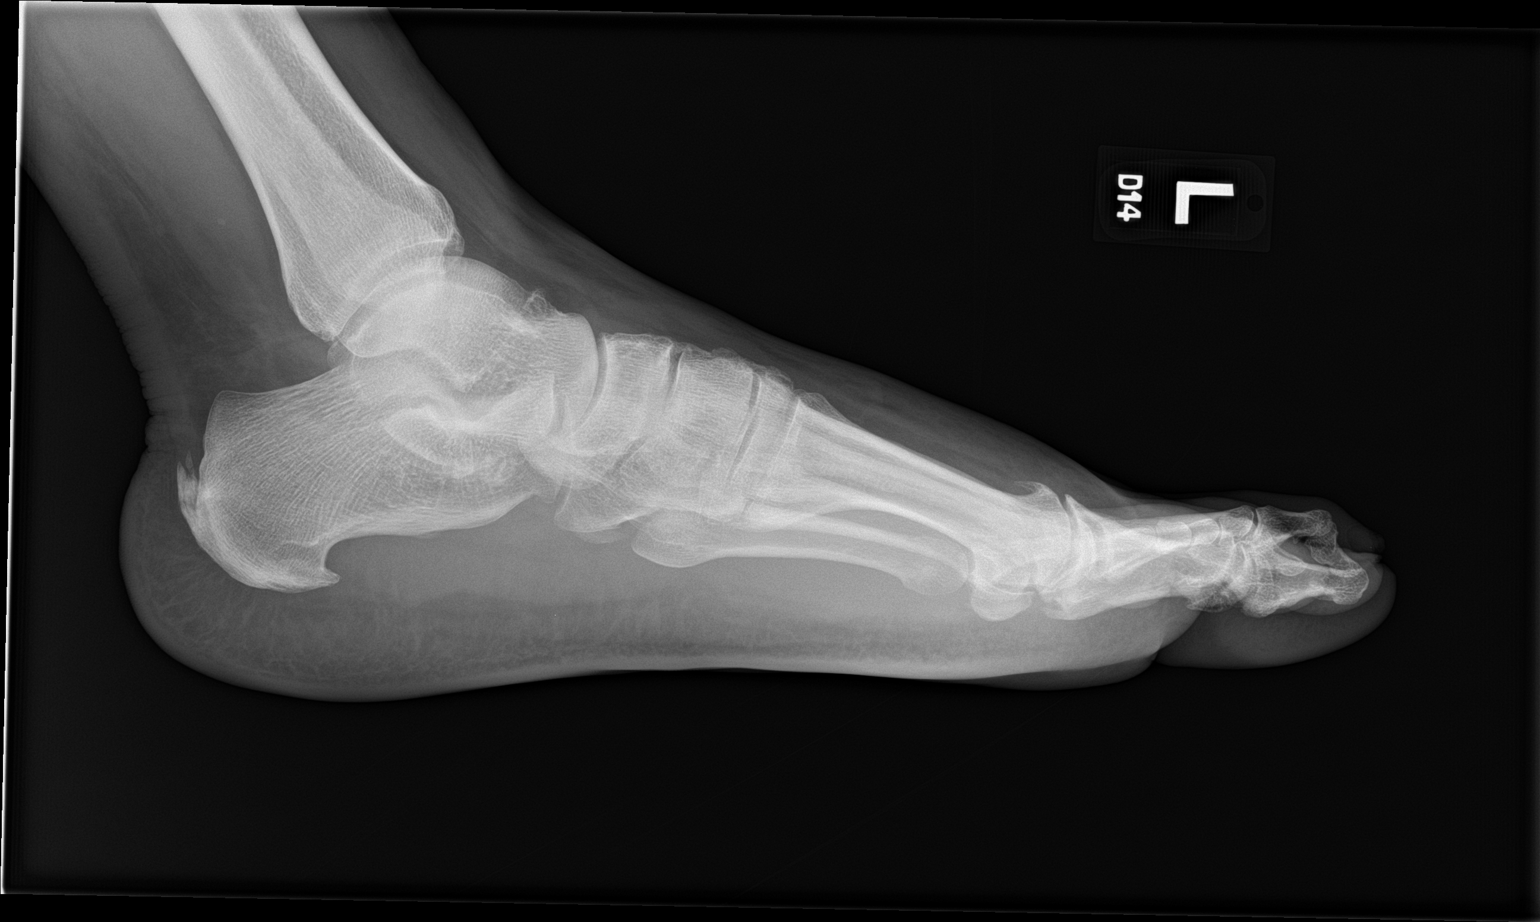

[3 of 3 positions shown; findings below may reference images not displayed]

FINDINGS: No acute fracture or dislocation. Chronic well-marginated erosions
along the medial aspect of the first metatarsal head compatible with
history of gout. Progressive osteoarthritis of the first MTP joint.
Bidirectional calcaneal enthesophytes. Soft tissues appear within
normal limits.
IMPRESSION: 1. No acute fracture or dislocation.
2. Chronic sequela of gout involving the first metatarsal head.

## 2021-10-22 ENCOUNTER — Emergency Department (HOSPITAL_COMMUNITY)
Admission: EM | Admit: 2021-10-22 | Discharge: 2021-10-22 | Disposition: A | Discharge status: Home / Self Care | Payer: Self-pay | Attending: Emergency Medicine | Admitting: Emergency Medicine

## 2021-10-22 ENCOUNTER — Encounter (HOSPITAL_COMMUNITY): Payer: Self-pay | Admitting: *Deleted

## 2021-10-22 ENCOUNTER — Other Ambulatory Visit: Payer: Self-pay

## 2021-10-22 ENCOUNTER — Emergency Department (HOSPITAL_COMMUNITY): Payer: Self-pay

## 2021-10-22 DIAGNOSIS — I10 Essential (primary) hypertension: Secondary | ICD-10-CM | POA: Insufficient documentation

## 2021-10-22 DIAGNOSIS — M109 Gout, unspecified: Secondary | ICD-10-CM

## 2021-10-22 DIAGNOSIS — Z79899 Other long term (current) drug therapy: Secondary | ICD-10-CM | POA: Insufficient documentation

## 2021-10-22 DIAGNOSIS — M10071 Idiopathic gout, right ankle and foot: Secondary | ICD-10-CM | POA: Insufficient documentation

## 2021-10-22 MED ORDER — HYDROCODONE-ACETAMINOPHEN 5-325 MG PO TABS: 1.0000 | ORAL_TABLET | ORAL | 0 refills | Status: DC | PRN

## 2021-10-22 MED ORDER — PREDNISONE 10 MG PO TABS: 40.0000 mg | ORAL_TABLET | Freq: Every day | ORAL | 0 refills | Status: DC

## 2021-10-22 MED ORDER — PREDNISONE 50 MG PO TABS
60.0000 mg | ORAL_TABLET | Freq: Once | ORAL | Status: AC
  Administered 2021-10-22: 60 mg via ORAL
  Filled 2021-10-22: qty 1

## 2021-10-22 MED ORDER — HYDROCODONE-ACETAMINOPHEN 5-325 MG PO TABS: 1.0000 | ORAL_TABLET | ORAL | 0 refills | Status: AC | PRN

## 2021-10-22 NOTE — Discharge Instructions (Signed)
Right foot seems to be consistent with gout.  No obvious bony abnormality of the left foot.  Take the prednisone as directed.  Also prepack of hydrocodone provided to help with some pain in the short run.  Work note provided if needed.  Return for any new or worse symptoms.

## 2021-10-22 NOTE — ED Provider Notes (Signed)
[facility] EMERGENCY DEPARTMENT Provider Note   CSN: [id number] Arrival date & time: 10/22/21  7425     History  Chief Complaint  Patient presents with   Foot Swelling    Barry Day is a 51 y.o. male.  Patient with a complaint of bilateral feet swelling.  The right foot has a lot of swelling around the base of the great toe that reminds him of previous bouts of gout.  The left foot has isolated swelling to the proximal forefoot just distal to the lateral malleolus area no fall or injury.  Patient last seen for an attack of gout at [facility] in June.  Past medical history significant for gout and hypertension.       Home Medications Prior to Admission medications   Medication Sig Start Date End Date Taking? Authorizing Provider  HYDROcodone-acetaminophen (NORCO/VICODIN) 5-325 MG tablet Take 1 tablet by mouth every 4 (four) hours as needed. 10/22/21  Yes Vanetta Mulders, MD  predniSONE (DELTASONE) 10 MG tablet Take 4 tablets (40 mg total) by mouth daily. 10/22/21  Yes Vanetta Mulders, MD  colchicine 0.6 MG tablet Take 1 tablet (0.6 mg total) by mouth daily. 06/29/19 06/28/20  Joni Reining, PA-C  cyclobenzaprine (FLEXERIL) 10 MG tablet Take 1 tablet (10 mg total) by mouth 3 (three) times daily as needed. 03/25/18   Joni Reining, PA-C  ibuprofen (ADVIL,MOTRIN) 600 MG tablet Take 1 tablet (600 mg total) by mouth every 8 (eight) hours as needed. 03/25/18   Joni Reining, PA-C  indomethacin (INDOCIN) 50 MG capsule Take 1 capsule (50 mg total) by mouth 3 (three) times daily with meals. 06/29/19   Joni Reining, PA-C  lisinopril (PRINIVIL,ZESTRIL) 10 MG tablet Take 10 mg by mouth daily.    [provider]  traMADol (ULTRAM) 50 MG tablet Take 1 tablet (50 mg total) by mouth every 6 (six) hours as needed for moderate pain. 06/29/19   Joni Reining, PA-C      Allergies    Patient has no known allergies.    Review of Systems   Review of Systems   Constitutional:  Negative for chills and fever.  HENT:  Negative for ear pain and sore throat.   Eyes:  Negative for pain and visual disturbance.  Respiratory:  Negative for cough and shortness of breath.   Cardiovascular:  Positive for leg swelling. Negative for chest pain and palpitations.  Gastrointestinal:  Negative for abdominal pain and vomiting.  Genitourinary:  Negative for dysuria and hematuria.  Musculoskeletal:  Negative for arthralgias and back pain.  Skin:  Negative for color change and rash.  Neurological:  Negative for seizures and syncope.  All other systems reviewed and are negative.   Physical Exam Updated Vital Signs BP (!) 178/122   Pulse 62   Temp 98.2 F (36.8 C) (Oral)   Resp 20   Ht 1.727 m (5\' 8" )   Wt 115.2 kg   SpO2 96%   BMI 38.62 kg/m  Physical Exam Vitals and nursing note reviewed.  Constitutional:      General: He is not in acute distress.    Appearance: He is well-developed.  HENT:     Head: Normocephalic and atraumatic.  Eyes:     Conjunctiva/sclera: Conjunctivae normal.  Cardiovascular:     Rate and Rhythm: Normal rate and regular rhythm.     Heart sounds: No murmur heard. Pulmonary:     Effort: Pulmonary effort is normal. No respiratory distress.  Breath sounds: Normal breath sounds.  Abdominal:     Palpations: Abdomen is soft.     Tenderness: There is no abdominal tenderness.  Musculoskeletal:        General: Tenderness present. No swelling.     Cervical back: Neck supple.     Comments: Patient with swelling to the right foot great toe and to the plantar part of the foot with increased warmth in that area tenderness to touch.  Left foot with tenderness just distal on the proximal forefoot with some swelling slight increased warmth just distal to the lateral malleolus.  Cap refill intact.  Skin:    General: Skin is warm and dry.     Capillary Refill: Capillary refill takes less than 2 seconds.  Neurological:     General: No  focal deficit present.     Mental Status: He is alert and oriented to person, place, and time.  Psychiatric:        Mood and Affect: Mood normal.     ED Results / Procedures / Treatments   Labs (all labs ordered are listed, but only abnormal results are displayed) Labs Reviewed - No data to display  EKG None  Radiology DG Foot Complete Right  Result Date: 10/22/2021 CLINICAL DATA:  Pain and swelling. EXAM: RIGHT FOOT COMPLETE - 3+ VIEW COMPARISON:  None Available. FINDINGS: There is no evidence of fracture or dislocation. There is no evidence of other focal bone abnormality. Soft tissues swelling about the first metatarsophalangeal joint. IMPRESSION: 1. No acute fracture or dislocation identified about the right foot. 2. Soft tissue swelling about the first metatarsophalangeal joint. Electronically Signed   By: Ted Mcalpine M.D.   On: 10/22/2021 08:31   DG Foot Complete Left  Result Date: 10/22/2021 CLINICAL DATA:  Acute bilateral foot swelling.  History of gout. EXAM: LEFT FOOT - COMPLETE 3+ VIEW COMPARISON:  June 29, 2019. FINDINGS: There is no evidence of fracture or dislocation. 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 There are noted erosions medially involving the base of the proximal first phalanx consistent with gout. Moderate posterior calcaneal spurring is noted. Soft tissues are unremarkable. 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 Grossly stable erosions around this joint consistent with gout. No acute abnormality is noted. Electronically Signed   By: Lupita Raider M.D.   On: 10/22/2021 08:31    Procedures Procedures    Medications Ordered in ED Medications  predniSONE (DELTASONE) tablet 60 mg (has no administration in time range)    ED Course/ Medical Decision Making/ A&P                           Medical Decision Making Amount and/or Complexity of Data Reviewed Radiology:  ordered.  Risk Prescription drug management.   Suspect flare of gout to the right foot.  Left foot little atypical we will get x-rays of both feet to rule out any kind of bony abnormalities.  X-ray of the right foot seems to be consistent with gout.  Will treat as gout.  Left foot little questionable but no bony abnormalities.  We will treat with a course of prednisone and short course of hydrocodone.  Final Clinical Impression(s) / ED Diagnoses Final diagnoses:  Acute gout of right foot, unspecified cause    Rx / DC Orders ED Discharge Orders          Ordered    predniSONE (DELTASONE) 10 MG tablet  Daily        10/22/21 0851    HYDROcodone-acetaminophen (NORCO/VICODIN) 5-325 MG tablet  Every 4 hours PRN        10/22/21 7824              Fredia Sorrow, MD 10/22/21 [phone]

## 2021-10-22 NOTE — ED Triage Notes (Signed)
Pt c/o bilateral feet swelling that started last night; pt denies any sob or cough

## 2021-12-24 ENCOUNTER — Other Ambulatory Visit: Payer: Self-pay

## 2021-12-24 ENCOUNTER — Encounter (HOSPITAL_COMMUNITY): Payer: Self-pay

## 2021-12-24 ENCOUNTER — Emergency Department (HOSPITAL_COMMUNITY)
Admission: EM | Admit: 2021-12-24 | Discharge: 2021-12-24 | Disposition: A | Discharge status: Home / Self Care | Payer: Self-pay | Attending: Emergency Medicine | Admitting: Emergency Medicine

## 2021-12-24 DIAGNOSIS — R030 Elevated blood-pressure reading, without diagnosis of hypertension: Secondary | ICD-10-CM | POA: Insufficient documentation

## 2021-12-24 DIAGNOSIS — M10032 Idiopathic gout, left wrist: Secondary | ICD-10-CM | POA: Insufficient documentation

## 2021-12-24 DIAGNOSIS — M109 Gout, unspecified: Secondary | ICD-10-CM

## 2021-12-24 MED ORDER — PREDNISONE 50 MG PO TABS
60.0000 mg | ORAL_TABLET | Freq: Once | ORAL | Status: AC
  Administered 2021-12-24: 60 mg via ORAL
  Filled 2021-12-24: qty 1

## 2021-12-24 MED ORDER — AMLODIPINE BESYLATE 10 MG PO TABS: 10.0000 mg | ORAL_TABLET | Freq: Every day | ORAL | 0 refills | Status: DC

## 2021-12-24 MED ORDER — PREDNISONE 20 MG PO TABS: 40.0000 mg | ORAL_TABLET | Freq: Every day | ORAL | 0 refills | Status: AC

## 2021-12-24 NOTE — ED Triage Notes (Addendum)
Pt presents with swelling to L hand that reportedly extends to forearm. Pt reports associated pain. Denies injury or wounds. Pt states the pain is primarily in the wrist. Pt with a hx of gout.

## 2021-12-24 NOTE — ED Provider Notes (Signed)
[facility] EMERGENCY DEPARTMENT Provider Note   CSN: [id number] Arrival date & time: 12/24/21  1048     History  Chief Complaint  Patient presents with   Hand Problem    Barry Day is a 51 y.o. male with a past medical history of gout who presents emergency department with concerns for left wrist pain onset 1 week.  Denies recent injury, trauma,.  Notes that typically with his gout he has flares to his knees and feet.  Has tried ibuprofen at home for symptoms.  Has been evaluated before previously for similar symptoms and has attempted to follow-up with a specialist however unable to at this time.  Patient also notes that he stopped taking his antihypertensives because he did not like the way they made him feel.  He did not follow-up with the person who prescribed the medications.  Denies chest pain, shortness of breath, abdominal pain, nausea, vomiting, urinary symptoms, headache.  The history is provided by the patient. No language interpreter was used.       Home Medications Prior to Admission medications   Medication Sig Start Date End Date Taking? Authorizing Provider  amLODipine (NORVASC) 10 MG tablet Take 1 tablet (10 mg total) by mouth daily. 12/24/21 01/23/22 Yes Charlynn Salih A, PA-C  colchicine 0.6 MG tablet Take 1 tablet (0.6 mg total) by mouth daily. 06/29/19 06/28/20  Joni Reining, PA-C  cyclobenzaprine (FLEXERIL) 10 MG tablet Take 1 tablet (10 mg total) by mouth 3 (three) times daily as needed. 03/25/18   Joni Reining, PA-C  HYDROcodone-acetaminophen (NORCO/VICODIN) 5-325 MG tablet Take 1 tablet by mouth every 4 (four) hours as needed. 10/22/21   Vanetta Mulders, MD  ibuprofen (ADVIL,MOTRIN) 600 MG tablet Take 1 tablet (600 mg total) by mouth every 8 (eight) hours as needed. 03/25/18   Joni Reining, PA-C  indomethacin (INDOCIN) 50 MG capsule Take 1 capsule (50 mg total) by mouth 3 (three) times daily with meals. 06/29/19   Joni Reining, PA-C  lisinopril  (PRINIVIL,ZESTRIL) 10 MG tablet Take 10 mg by mouth daily.    [provider]  predniSONE (DELTASONE) 20 MG tablet Take 2 tablets (40 mg total) by mouth daily for 5 days. 12/24/21 12/29/21  Berit Raczkowski A, PA-C  traMADol (ULTRAM) 50 MG tablet Take 1 tablet (50 mg total) by mouth every 6 (six) hours as needed for moderate pain. 06/29/19   Joni Reining, PA-C      Allergies    Patient has no known allergies.    Review of Systems   Review of Systems  All other systems reviewed and are negative.   Physical Exam Updated Vital Signs BP (!) 191/112 (BP Location: Right Arm)   Pulse 80   Temp 98.1 F (36.7 C) (Oral)   Resp 19   Ht 5\' 8"  (1.727 m)   Wt 113.4 kg   SpO2 92%   BMI 38.01 kg/m  Physical Exam Vitals and nursing note reviewed.  Constitutional:      General: He is not in acute distress.    Appearance: Normal appearance.  Eyes:     General: No scleral icterus.    Extraocular Movements: Extraocular movements intact.  Cardiovascular:     Rate and Rhythm: Normal rate.  Pulmonary:     Effort: Pulmonary effort is normal. No respiratory distress.  Abdominal:     Palpations: Abdomen is soft. There is no mass.     Tenderness: There is no abdominal tenderness.  Musculoskeletal:  General: Normal range of motion.     Cervical back: Neck supple.     Comments: Tenderness to palpation noted to the dorsal aspect of left wrist with swelling noted to the area.  Skin:    General: Skin is warm and dry.     Findings: No rash.  Neurological:     Mental Status: He is alert.     Sensory: Sensation is intact.     Motor: Motor function is intact.  Psychiatric:        Behavior: Behavior normal.     ED Results / Procedures / Treatments   Labs (all labs ordered are listed, but only abnormal results are displayed) Labs Reviewed - No data to display  EKG None  Radiology No results found.  Procedures Procedures    Medications Ordered in ED Medications   predniSONE (DELTASONE) tablet 60 mg (60 mg Oral Given 12/24/21 1325)    ED Course/ Medical Decision Making/ A&P Clinical Course as of 12/25/21 1906  Tue Dec 24, 2021  1443 Pt re-evaluated and blood pressure obtained by myself at bedside with new blood pressure at 169/116. Lengthy in-depth conversation held with patient at bedside regarding elevated blood pressure and importance of him taking his medications as prescribed. Informed patient on how to take his blood pressure at home. Answered all available questions. Pt appears safe for discharge at this time.  [SB]    Clinical Course User Index [SB] Jeree Delcid A, PA-C                           Medical Decision Making Risk Prescription drug management.   Pt presents with concerns for left wrist pain onset 1 week.  Has a history of gout and has recently completed course of indomethacin, prednisone. History of similar symptoms. Denies eating red meat, seafood, or beer prior to the onset of his symptoms. Vital signs, pt afebrile. On exam, pt with tenderness to palpation noted to the dorsal aspect of left wrist with swelling noted to the area.  decreased flexion and extension of left wrist secondary to pain. No overlying skin changes. Differential diagnosis includes gout, septic arthritis, osteoarthritis, cellulitis.    Co morbidities that complicate the patient evaluation: Gout   Additional history obtained:  External records from outside source obtained and reviewed including: Pt has been evaluated multiple times for similar concerns on 11/12/21 and 10/22/21.   Medications:  I ordered medication including prednisone for symptom management Reevaluation of the patient after these medicines and interventions, I reevaluated the patient and found that they have improved I have reviewed the patients home medicines and have made adjustments as needed   Disposition: Presenting suspicious for gout.  Doubt septic arthritis, patient afebrile,  full active range of motion of left wrist.  Doubt osteoarthritis at this time.  Doubt cellulitis at this time, no overlying skin changes. After consideration of the diagnostic results and the patients response to treatment, I feel that the patient would benefit from Discharge home.  Patient is in no other prescription for prednisone and instructed to follow-up with primary care provider.  In-depth conversation held with patient regarding his elevated blood pressure.  Patient recent darted on his 10 mg Norvasc and instructed to take the medication as prescribed.  Also instructed to follow-up with his primary care provider regarding today's ED visit.  Supportive care measures and strict return precautions discussed with patient at bedside. Pt acknowledges and verbalizes understanding. Pt appears  safe for discharge. Follow up as indicated in discharge paperwork.    This chart was dictated using voice recognition software, Dragon. Despite the best efforts of this provider to proofread and correct errors, errors may still occur which can change documentation meaning.   Final Clinical Impression(s) / ED Diagnoses Final diagnoses:  Elevated blood pressure reading  Acute gout of left wrist, unspecified cause    Rx / DC Orders ED Discharge Orders          Ordered    amLODipine (NORVASC) 10 MG tablet  Daily        12/24/21 1446    predniSONE (DELTASONE) 20 MG tablet  Daily        12/24/21 1446              Sorcha Rotunno A, PA-C 12/25/21 1906    Vanetta Mulders, MD 12/28/21 0001

## 2021-12-24 NOTE — Discharge Instructions (Addendum)
It was a pleasure taking care of you today!  You will be treated with prednisone, take as directed. You may take over the counter 600 mg ibuprofen every 6 hours as needed for your symptoms for no more than 7 days. You will be sent a prescription for Norvasc, take as directed for your blood pressure. It is important that you monitor your blood pressure at home and keep a log of your blood pressure. Attached is information for the [facility] department as well as the [facility] Medicine to call and set up a follow up appointment regarding todays ED visit. Return to the ED if you are experiencing increasing/worsening symptoms.

## 2022-01-25 ENCOUNTER — Other Ambulatory Visit: Payer: Self-pay

## 2022-01-25 ENCOUNTER — Encounter: Payer: Self-pay | Admitting: Emergency Medicine

## 2022-01-25 ENCOUNTER — Emergency Department
Admission: EM | Admit: 2022-01-25 | Discharge: 2022-01-25 | Disposition: A | Discharge status: Home / Self Care | Payer: Self-pay | Attending: Emergency Medicine | Admitting: Emergency Medicine

## 2022-01-25 ENCOUNTER — Emergency Department: Payer: Self-pay

## 2022-01-25 DIAGNOSIS — M109 Gout, unspecified: Secondary | ICD-10-CM | POA: Insufficient documentation

## 2022-01-25 DIAGNOSIS — I10 Essential (primary) hypertension: Secondary | ICD-10-CM | POA: Insufficient documentation

## 2022-01-25 MED ORDER — AMLODIPINE BESYLATE 10 MG PO TABS: 10.0000 mg | ORAL_TABLET | Freq: Every day | ORAL | 2 refills | Status: DC

## 2022-01-25 MED ORDER — AMLODIPINE BESYLATE 10 MG PO TABS: 10.0000 mg | ORAL_TABLET | Freq: Every day | ORAL | 0 refills | Status: DC

## 2022-01-25 MED ORDER — INDOMETHACIN 50 MG PO CAPS: 50.0000 mg | ORAL_CAPSULE | Freq: Three times a day (TID) | ORAL | 0 refills | Status: AC

## 2022-01-25 MED ORDER — COLCHICINE 0.6 MG PO TABS
1.2000 mg | ORAL_TABLET | Freq: Once | ORAL | Status: AC
  Administered 2022-01-25: 1.2 mg via ORAL
  Filled 2022-01-25: qty 2

## 2022-01-25 MED ORDER — PREDNISONE 10 MG PO TABS: ORAL_TABLET | ORAL | 0 refills | Status: DC

## 2022-01-25 MED ORDER — INDOMETHACIN 50 MG PO CAPS: 50.0000 mg | ORAL_CAPSULE | Freq: Three times a day (TID) | ORAL | 0 refills | Status: DC

## 2022-01-25 MED ORDER — PREDNISONE 10 MG PO TABS: ORAL_TABLET | ORAL | 0 refills | Status: AC

## 2022-01-25 NOTE — Discharge Instructions (Addendum)
Please go to the following website to schedule new (and existing) patient appointments:   [email or web address]   The following is a list of primary care offices in the area who are accepting new patients at this time.  Please reach out to one of them directly and let them know you would like to schedule an appointment to follow up on an Emergency Department visit, and/or to establish a new primary care provider (PCP).  There are likely other primary care clinics in the are who are accepting new patients, but this is an excellent place to start:  [facility] Lead physician: Dr Angela Bacigalupo [address] [place], [place] [address] [phone]  [facility] Lead Physician: Dr Krichna Sowles [address], [place], [place] [address] [phone]  [facility]  Lead Physician: Dr Megan Johnson [address], [place], [place] [address] [phone]  [facility] [place] [facility] Lead Physician: Dr Alex Karamalegos [address], [place], [place] [address] [phone]  [facility] [place] Lead Physician: Dr Laura Berglund [address], [place], [place] [address] [phone]   

## 2022-01-25 NOTE — ED Triage Notes (Signed)
Pt via POV from home. Pt c/o R foot pain. States had a sudden onset of  R foot swelling and pain this AM around 3:30am. Denies injuries. Pt is A&Ox4 and NAD

## 2022-01-25 NOTE — ED Provider Notes (Signed)
[facility] Provider Note    Event Date/Time   First MD Initiated Contact with Patient 01/25/22 1116     (approximate)   History   Foot Pain   HPI  Barry Day is a 51 y.o. male presents to the emergency department with right foot pain.  Endorses 1 day history of severe right foot pain when he woke up from sleep.  Denies any fever or chills.  No nausea or vomiting.  Does have a history of gout.  Increase of eating meat and alcohol over the holiday season.  States that he has been out of his home blood pressure medication and difficulty getting in with primary care provider.  Denies any history of CKD but does endorse a history of hypertension.     Physical Exam   Triage Vital Signs: ED Triage Vitals  Enc Vitals Group     BP 01/25/22 1042 (!) 167/109     Pulse Rate 01/25/22 1042 72     Resp 01/25/22 1042 16     Temp 01/25/22 1042 98.3 F (36.8 C)     Temp Source 01/25/22 1042 Oral     SpO2 01/25/22 1042 97 %     Weight 01/25/22 1036 245 lb (111.1 kg)     Height 01/25/22 1036 5\' 8"  (1.727 m)     Head Circumference --      Peak Flow --      Pain Score 01/25/22 1036 10     Pain Loc --      Pain Edu? --      Excl. in GC? --     Most recent vital signs: Vitals:   01/25/22 1042 01/25/22 1158  BP: (!) 167/109 (!) 115/90  Pulse: 72 64  Resp: 16 16  Temp: 98.3 F (36.8 C) 98.3 F (36.8 C)  SpO2: 97% 96%    Physical Exam Constitutional:      Appearance: He is well-developed.  HENT:     Head: Atraumatic.  Eyes:     Conjunctiva/sclera: Conjunctivae normal.  Cardiovascular:     Rate and Rhythm: Regular rhythm.  Pulmonary:     Effort: No respiratory distress.  Musculoskeletal:     Cervical back: Normal range of motion.  Feet:     Comments: Tenderness to palpation to the right toe and midfoot.  Overlying warmth but no erythema or induration.  No crepitus.  +2 DP pulses. Skin:    General: Skin is warm.  Neurological:     Mental  Status: He is alert. Mental status is at baseline.      IMPRESSION / MDM / ASSESSMENT AND PLAN / ED COURSE  I reviewed the triage vital signs and the nursing notes.  Differential diagnosis including fracture, dislocation, gout, pseudogout, septic joint   RADIOLOGY I independently reviewed imaging, my interpretation of imaging: Degenerative changes of the first MTP.  Read as similar changes but no acute findings.   Labs (all labs ordered are listed, but only abnormal results are displayed) Labs interpreted as -    Labs Reviewed - No data to display    Clinical picture is most concerning for gout.  Low suspicion for septic joint.  No overlying signs of cellulitis.  Will start the patient on colchicine and given first dose in the emergency department.  Will schedule indomethacin and started on prednisone.  Given information to follow-up with her primary care physician.  Given a refill for amlodipine.  Given return precautions for worsening symptoms  or signs of an infection.   PROCEDURES:  Critical Care performed: No  Procedures  Patient's presentation is most consistent with acute presentation with potential threat to life or bodily function.   MEDICATIONS ORDERED IN ED: Medications  colchicine tablet 1.2 mg (1.2 mg Oral Given 01/25/22 1156)    FINAL CLINICAL IMPRESSION(S) / ED DIAGNOSES   Final diagnoses:  Acute gout involving toe of right foot, unspecified cause     Rx / DC Orders   ED Discharge Orders          Ordered    predniSONE (DELTASONE) 10 MG tablet  Daily,   Status:  Discontinued        01/25/22 1154    indomethacin (INDOCIN) 50 MG capsule  3 times daily with meals,   Status:  Discontinued        01/25/22 1155    amLODipine (NORVASC) 10 MG tablet  Daily,   Status:  Discontinued        01/25/22 1208    indomethacin (INDOCIN) 50 MG capsule  3 times daily with meals        01/25/22 1208    predniSONE (DELTASONE) 10 MG tablet  Daily        01/25/22  1208    amLODipine (NORVASC) 10 MG tablet  Daily        01/25/22 1209             Note:  This document was prepared using Dragon voice recognition software and may include unintentional dictation errors.   Corena Herter, MD 01/25/22 1231

## 2022-06-04 ENCOUNTER — Other Ambulatory Visit: Payer: Self-pay

## 2022-06-04 ENCOUNTER — Encounter (HOSPITAL_COMMUNITY): Payer: Self-pay | Admitting: Emergency Medicine

## 2022-06-04 ENCOUNTER — Emergency Department (HOSPITAL_COMMUNITY): Payer: Self-pay

## 2022-06-04 ENCOUNTER — Emergency Department (HOSPITAL_COMMUNITY)
Admission: EM | Admit: 2022-06-04 | Discharge: 2022-06-04 | Disposition: A | Discharge status: Home / Self Care | Payer: Self-pay | Attending: Emergency Medicine | Admitting: Emergency Medicine

## 2022-06-04 DIAGNOSIS — R519 Headache, unspecified: Secondary | ICD-10-CM

## 2022-06-04 DIAGNOSIS — Z79899 Other long term (current) drug therapy: Secondary | ICD-10-CM | POA: Insufficient documentation

## 2022-06-04 DIAGNOSIS — I10 Essential (primary) hypertension: Secondary | ICD-10-CM | POA: Insufficient documentation

## 2022-06-04 LAB — CBC
HCT: 40.6 % (ref 39.0–52.0)
Hemoglobin: 13.2 g/dL (ref 13.0–17.0)
MCH: 27.7 pg (ref 26.0–34.0)
MCHC: 32.5 g/dL (ref 30.0–36.0)
MCV: 85.1 fL (ref 80.0–100.0)
Platelets: 282 10*3/uL (ref 150–400)
RBC: 4.77 MIL/uL (ref 4.22–5.81)
RDW: 13.9 % (ref 11.5–15.5)
WBC: 5.6 10*3/uL (ref 4.0–10.5)
nRBC: 0 % (ref 0.0–0.2)

## 2022-06-04 LAB — BASIC METABOLIC PANEL
Anion gap: 9 (ref 5–15)
BUN: 13 mg/dL (ref 6–20)
CO2: 26 mmol/L (ref 22–32)
Calcium: 9.2 mg/dL (ref 8.9–10.3)
Chloride: 102 mmol/L (ref 98–111)
Creatinine, Ser: 1.64 mg/dL — ABNORMAL HIGH (ref 0.61–1.24)
GFR, Estimated: 51 mL/min — ABNORMAL LOW (ref >60)
Glucose, Bld: 105 mg/dL — ABNORMAL HIGH (ref 70–99)
Potassium: 4 mmol/L (ref 3.5–5.1)
Sodium: 137 mmol/L (ref 135–145)

## 2022-06-04 LAB — URINALYSIS, ROUTINE W REFLEX MICROSCOPIC
Bilirubin Urine: NEGATIVE
Glucose, UA: NEGATIVE mg/dL
Ketones, ur: NEGATIVE mg/dL
Leukocytes,Ua: NEGATIVE
Nitrite: NEGATIVE
Protein, ur: 30 mg/dL — AB
Specific Gravity, Urine: 1.011 (ref 1.005–1.030)
pH: 6 (ref 5.0–8.0)

## 2022-06-04 LAB — TROPONIN I (HIGH SENSITIVITY)
Troponin I (High Sensitivity): 56 ng/L — ABNORMAL HIGH (ref <18)
Troponin I (High Sensitivity): 62 ng/L — ABNORMAL HIGH (ref <18)

## 2022-06-04 LAB — CBG MONITORING, ED: Glucose-Capillary: 109 mg/dL — ABNORMAL HIGH (ref 70–99)

## 2022-06-04 MED ORDER — PROCHLORPERAZINE EDISYLATE 10 MG/2ML IJ SOLN
10.0000 mg | Freq: Once | INTRAMUSCULAR | Status: AC
  Administered 2022-06-04: 10 mg via INTRAVENOUS
  Filled 2022-06-04: qty 2

## 2022-06-04 MED ORDER — DIPHENHYDRAMINE HCL 50 MG/ML IJ SOLN
12.5000 mg | Freq: Once | INTRAMUSCULAR | Status: AC
  Administered 2022-06-04: 12.5 mg via INTRAVENOUS
  Filled 2022-06-04: qty 1

## 2022-06-04 MED ORDER — KETOROLAC TROMETHAMINE 15 MG/ML IJ SOLN
15.0000 mg | Freq: Once | INTRAMUSCULAR | Status: AC
  Administered 2022-06-04: 15 mg via INTRAVENOUS
  Filled 2022-06-04: qty 1

## 2022-06-04 MED ORDER — ACETAMINOPHEN 500 MG PO TABS
1000.0000 mg | ORAL_TABLET | Freq: Once | ORAL | Status: AC
  Administered 2022-06-04: 1000 mg via ORAL
  Filled 2022-06-04: qty 2

## 2022-06-04 MED ORDER — AMLODIPINE BESYLATE 5 MG PO TABS
10.0000 mg | ORAL_TABLET | Freq: Every day | ORAL | Status: DC
  Administered 2022-06-04: 10 mg via ORAL
  Filled 2022-06-04: qty 2

## 2022-06-04 MED ORDER — AMLODIPINE BESYLATE 10 MG PO TABS: 10.0000 mg | ORAL_TABLET | Freq: Every day | ORAL | 1 refills | Status: AC

## 2022-06-04 NOTE — Discharge Instructions (Addendum)
Evaluation today revealed that you BP was very high which was likely contributing to your headache and dizziness today. I have restarted you on amlodipine at your home dose. I have sent a 30 day supply plus one refill to your pharmacy. Please take daily as prescribed. Also recommend you follow up with your pcp regarding your high blood pressure as you will need medical management moving forward. If you develop a worsening headache, chest pain, shortness of breath, abdominal pain, or urinary changes please return to ED for further evaluation.

## 2022-06-04 NOTE — ED Triage Notes (Signed)
Checked bp today and was high. Pt states had headache today but now. C/o gen weakness. Denies n/v. Had diarrhea x 2 this am. Pt c/o dizziness earlier today but not now. Lasted approx 10 min. Denies trouble swallowing or speaking. A/o. Color wnl/non diaphoretic in triage.

## 2022-06-04 NOTE — ED Notes (Signed)
ED Provider at bedside. 

## 2022-06-04 NOTE — ED Notes (Signed)
Pt has bad toothache he states on right upper.  PA notified

## 2022-06-04 NOTE — ED Provider Notes (Signed)
[facility] AT [facility] Provider Note   CSN: [id number] Arrival date & time: 06/04/22  1526     History {Add pertinent medical, surgical, social history, OB history to HPI:1} Chief Complaint  Patient presents with   Weakness   HPI Barry Day is a 52 y.o. male with hypertension presenting for headache and dizziness.  States he was driving his car today and started to feel dizzy with a headache.  He stopped the car and called his son to take him to the ED.  Headache is primarily located on the right side of his head.  He associates room spinning sensation and loss of balance with his dizziness.  Denies shortness of breath or chest pain.  States that he normally takes amlodipine but has not been for the last couple months due to difficulties refilling his medication.  Denies urinary changes.  Denies abdominal pain.   Weakness      Home Medications Prior to Admission medications   Medication Sig Start Date End Date Taking? Authorizing Provider  amLODipine (NORVASC) 10 MG tablet Take 1 tablet (10 mg total) by mouth daily. 01/25/22 04/25/22  Corena Herter, MD  colchicine 0.6 MG tablet Take 1 tablet (0.6 mg total) by mouth daily. 06/29/19 06/28/20  Joni Reining, PA-C  cyclobenzaprine (FLEXERIL) 10 MG tablet Take 1 tablet (10 mg total) by mouth 3 (three) times daily as needed. 03/25/18   Joni Reining, PA-C  HYDROcodone-acetaminophen (NORCO/VICODIN) 5-325 MG tablet Take 1 tablet by mouth every 4 (four) hours as needed. 10/22/21   Vanetta Mulders, MD  ibuprofen (ADVIL,MOTRIN) 600 MG tablet Take 1 tablet (600 mg total) by mouth every 8 (eight) hours as needed. 03/25/18   Joni Reining, PA-C  lisinopril (PRINIVIL,ZESTRIL) 10 MG tablet Take 10 mg by mouth daily.    [provider]  traMADol (ULTRAM) 50 MG tablet Take 1 tablet (50 mg total) by mouth every 6 (six) hours as needed for moderate pain. 06/29/19   Joni Reining, PA-C      Allergies     Patient has no known allergies.    Review of Systems   Review of Systems  Neurological:  Positive for weakness.    Physical Exam   Vitals:   06/04/22 1547 06/04/22 1855  BP: (!) 204/128 (!) 230/138  Pulse: 66 71  Resp: 20 18  Temp: 98.5 F (36.9 C) 99.1 F (37.3 C)  SpO2: 97% 98%    CONSTITUTIONAL:  well-appearing, NAD NEURO:  GCS 15. Speech is goal oriented. No deficits appreciated to CN III-XII; symmetric eyebrow raise, no facial drooping, tongue midline. Patient has equal grip strength bilaterally with 5/5 strength against resistance in all major muscle groups bilaterally. Sensation to light touch intact. Patient moves extremities without ataxia. Normal finger-nose-finger. Patient ambulatory with steady gait.  EYES:  eyes equal and reactive ENT/NECK:  Supple, no stridor  CARDIO:  regular rate and rhythm, appears well-perfused  PULM:  No respiratory distress, CTAB GI/GU:  non-distended, soft, non tender MSK/SPINE:  No gross deformities, no edema, moves all extremities  SKIN:  no rash, atraumatic   *Additional and/or pertinent findings included in MDM below    ED Results / Procedures / Treatments   Labs (all labs ordered are listed, but only abnormal results are displayed) Labs Reviewed  BASIC METABOLIC PANEL - Abnormal; Notable for the following components:      Result Value   Glucose, Bld 105 (*)    Creatinine, Ser  1.64 (*)    GFR, Estimated 51 (*)    All other components within normal limits  CBG MONITORING, ED - Abnormal; Notable for the following components:   Glucose-Capillary 109 (*)    All other components within normal limits  CBC  URINALYSIS, ROUTINE W REFLEX MICROSCOPIC    EKG None  Radiology No results found.  Procedures Procedures  {Document cardiac monitor, telemetry assessment procedure when appropriate:1}  Medications Ordered in ED Medications - No data to display  ED Course/ Medical Decision Making/ A&P   {   Click here for  ABCD2, HEART and other calculatorsREFRESH Note before signing :1}                          Medical Decision Making Amount and/or Complexity of Data Reviewed Labs: ordered.   ***  {Document critical care time when appropriate:1} {Document review of labs and clinical decision tools ie heart score, Chads2Vasc2 etc:1}  {Document your independent review of radiology images, and any outside records:1} {Document your discussion with family members, caretakers, and with consultants:1} {Document social determinants of health affecting pt's care:1} {Document your decision making why or why not admission, treatments were needed:1} Final Clinical Impression(s) / ED Diagnoses Final diagnoses:  None    Rx / DC Orders ED Discharge Orders     None
